# Patient Record
Sex: Male | Born: 1955 | Race: White | Hispanic: No | Marital: Married | Smoking: Never smoker
Health system: Southern US, Community
[De-identification: ages and names within clinical notes are randomized; demographics above are authoritative.]

## PROBLEM LIST (undated history)

## (undated) DIAGNOSIS — I4510 Unspecified right bundle-branch block: Secondary | ICD-10-CM

## (undated) DIAGNOSIS — M549 Dorsalgia, unspecified: Secondary | ICD-10-CM

## (undated) DIAGNOSIS — R109 Unspecified abdominal pain: Secondary | ICD-10-CM

## (undated) DIAGNOSIS — I10 Essential (primary) hypertension: Secondary | ICD-10-CM

## (undated) DIAGNOSIS — E739 Lactose intolerance, unspecified: Secondary | ICD-10-CM

## (undated) DIAGNOSIS — K5790 Diverticulosis of intestine, part unspecified, without perforation or abscess without bleeding: Secondary | ICD-10-CM

## (undated) DIAGNOSIS — M2550 Pain in unspecified joint: Secondary | ICD-10-CM

## (undated) DIAGNOSIS — N200 Calculus of kidney: Secondary | ICD-10-CM

## (undated) DIAGNOSIS — R7303 Prediabetes: Secondary | ICD-10-CM

## (undated) DIAGNOSIS — K5900 Constipation, unspecified: Secondary | ICD-10-CM

## (undated) DIAGNOSIS — T7840XA Allergy, unspecified, initial encounter: Secondary | ICD-10-CM

## (undated) HISTORY — DX: Dorsalgia, unspecified: M54.9

## (undated) HISTORY — DX: Constipation, unspecified: K59.00

## (undated) HISTORY — PX: POLYPECTOMY: SHX149

## (undated) HISTORY — DX: Prediabetes: R73.03

## (undated) HISTORY — DX: Essential (primary) hypertension: I10

## (undated) HISTORY — DX: Lactose intolerance, unspecified: E73.9

## (undated) HISTORY — DX: Pain in unspecified joint: M25.50

## (undated) HISTORY — PX: VASECTOMY: SHX75

## (undated) HISTORY — PX: REFRACTIVE SURGERY: SHX103

## (undated) HISTORY — DX: Allergy, unspecified, initial encounter: T78.40XA

## (undated) HISTORY — PX: COLONOSCOPY: SHX174

## (undated) HISTORY — DX: Calculus of kidney: N20.0

## (undated) HISTORY — DX: Diverticulosis of intestine, part unspecified, without perforation or abscess without bleeding: K57.90

## (undated) HISTORY — DX: Unspecified right bundle-branch block: I45.10

## (undated) HISTORY — DX: Unspecified abdominal pain: R10.9

---

## 1988-02-09 HISTORY — PX: VASECTOMY: SHX75

## 2001-05-16 ENCOUNTER — Emergency Department (HOSPITAL_COMMUNITY): Admission: EM | Admit: 2001-05-16 | Discharge: 2001-05-17 | Payer: Self-pay | Admitting: Emergency Medicine

## 2008-01-17 ENCOUNTER — Ambulatory Visit: Payer: Self-pay | Admitting: Gastroenterology

## 2008-01-23 ENCOUNTER — Ambulatory Visit: Payer: Self-pay | Admitting: Gastroenterology

## 2009-10-16 ENCOUNTER — Encounter: Admission: RE | Admit: 2009-10-16 | Discharge: 2009-10-16 | Payer: Self-pay | Admitting: Family Medicine

## 2011-03-09 ENCOUNTER — Ambulatory Visit (INDEPENDENT_AMBULATORY_CARE_PROVIDER_SITE_OTHER): Payer: BC Managed Care – PPO | Admitting: Family Medicine

## 2011-03-09 VITALS — BP 124/78 | HR 88 | Temp 98.5°F | Resp 16 | Ht 71.0 in | Wt 238.8 lb

## 2011-03-09 DIAGNOSIS — N451 Epididymitis: Secondary | ICD-10-CM

## 2011-03-09 DIAGNOSIS — N453 Epididymo-orchitis: Secondary | ICD-10-CM

## 2011-03-09 MED ORDER — CIPROFLOXACIN HCL 500 MG PO TABS: 500.0000 mg | ORAL_TABLET | Freq: Two times a day (BID) | ORAL | Status: AC

## 2011-03-09 NOTE — Progress Notes (Signed)
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 He comes in with a three-day history of constant left posterior testicle pain. He's not had this problem before. Some slight increase in size the left testicle as well. Had no fever, chills, nausea or vomiting.  S/P vasectomy 23 years ago  Objective: Left testicle is mildly swollen with no hydrocele noted. The posterior aspect of the left testicle is tender and slightly swollen.  Assessment: Epididymitis  Plan: Cipro. Patient instructed to call me back if not better in 24-48 hours.

## 2011-05-12 ENCOUNTER — Ambulatory Visit (INDEPENDENT_AMBULATORY_CARE_PROVIDER_SITE_OTHER): Payer: BC Managed Care – PPO | Admitting: Physician Assistant

## 2011-05-12 VITALS — BP 107/73 | HR 98 | Temp 99.0°F | Resp 16 | Ht 71.0 in | Wt 244.0 lb

## 2011-05-12 DIAGNOSIS — H1010 Acute atopic conjunctivitis, unspecified eye: Secondary | ICD-10-CM

## 2011-05-12 DIAGNOSIS — J309 Allergic rhinitis, unspecified: Secondary | ICD-10-CM

## 2011-05-12 DIAGNOSIS — H1045 Other chronic allergic conjunctivitis: Secondary | ICD-10-CM

## 2011-05-12 MED ORDER — AZELASTINE HCL 0.05 % OP SOLN: 1.0000 [drp] | Freq: Two times a day (BID) | OPHTHALMIC | Status: DC

## 2011-05-12 MED ORDER — FLUTICASONE PROPIONATE 50 MCG/ACT NA SUSP: 2.0000 | Freq: Every day | NASAL | Status: DC

## 2011-05-12 NOTE — Progress Notes (Signed)
  Subjective:    Patient ID: Maurice Hart, male    DOB: Nov 18, 1955, 56 y.o.   MRN: [id number]  HPI Oak pollen reaction annually.  Left eye gets red, itchy, swollen.  Symptoms began today. Has not yet developed the nasal symptoms, but often does 1-2 weeks after the eye symptoms begin.    No drainage, vision changes, fever, chills.  Accompanied by wife. Review of Systems As above.    Objective:   Physical Exam  Vital signs noted. Well-developed, well nourished WM who is awake, alert and oriented, in NAD. HEENT: [place]/AT, PERRL, EOMI.  Sclera and conjunctiva are mildly injected.  Fundi are normal bilaterally. Neck: supple, non-tender, no lymphadenopathy, thyromegaly.     Assessment & Plan:   1. Allergic conjunctivitis, acute  azelastine (OPTIVAR) 0.05 % ophthalmic solution  2. Allergic rhinitis  fluticasone (FLONASE) 50 MCG/ACT nasal spray   Allergen avoidance whenever possible.

## 2011-05-12 NOTE — Patient Instructions (Signed)
Allergen avoidance (wink, wink).

## 2011-09-02 ENCOUNTER — Ambulatory Visit (INDEPENDENT_AMBULATORY_CARE_PROVIDER_SITE_OTHER): Payer: BC Managed Care – PPO | Admitting: Internal Medicine

## 2011-09-02 ENCOUNTER — Ambulatory Visit: Payer: BC Managed Care – PPO

## 2011-09-02 VITALS — BP 128/88 | HR 62 | Temp 97.7°F | Resp 17 | Ht 71.0 in | Wt 238.0 lb

## 2011-09-02 DIAGNOSIS — M25569 Pain in unspecified knee: Secondary | ICD-10-CM

## 2011-09-02 DIAGNOSIS — M25462 Effusion, left knee: Secondary | ICD-10-CM

## 2011-09-02 DIAGNOSIS — R209 Unspecified disturbances of skin sensation: Secondary | ICD-10-CM

## 2011-09-02 DIAGNOSIS — M79601 Pain in right arm: Secondary | ICD-10-CM

## 2011-09-02 DIAGNOSIS — M25562 Pain in left knee: Secondary | ICD-10-CM

## 2011-09-02 DIAGNOSIS — M25539 Pain in unspecified wrist: Secondary | ICD-10-CM

## 2011-09-02 DIAGNOSIS — M25469 Effusion, unspecified knee: Secondary | ICD-10-CM

## 2011-09-02 MED ORDER — DOXYCYCLINE HYCLATE 100 MG PO TABS: 100.0000 mg | ORAL_TABLET | Freq: Two times a day (BID) | ORAL | Status: AC

## 2011-09-02 NOTE — Patient Instructions (Addendum)
Carpal Tunnel Syndrome You may have carpal tunnel syndrome. This is a common condition. Carpal tunnel syndrome occurs when the tendons, bones, or ligaments in the wrist press against the median nerve as it passes into the hand.  Symptoms can include:  Intermittent numbness.   Pain or a tingling sensation in thumb and first two fingers.  The pain may radiate up to the shoulder. There may even be weakness in the hand muscles. The pain is often worse at night and in the early morning. Nerve conduction tests may be used to prove the diagnosis. Carpal tunnel syndrome is most often due to repeated movements of the hand or wrist. Other causes can include:  Prior injuries.   Diabetes.   Obesity.   Smoking.   Pregnancy. Symptoms that develop during pregnancy often stop when the pregnancy is over.  Treatment includes:  Splinting - A wrist splint helps prevent movements that irritate the nerve. Splints are especially helpful at night when the symptoms are often worse.   Ice packs - Cold packs applied to the palm side of the wrist for 20 minutes every 2 hours while awake may give some relief.   Medication - Medicine to reduce inflammation and pain are often used. Cortisone injections around the nerve may also bring improvement.  Severe cases of carpal tunnel syndrome can require surgery to relieve the pressure on the nerve. This may be necessary if there is evidence of weakness or decreased sensation in your hand, or if your symptoms do not improve with conservative treatment. See your caregiver for follow-up to be certain your condition is improving. Document Released: 02/25/2004 Document Revised: 09/29/2010 Document Reviewed: 11/23/2006 ExitCare Patient Information 2012 [employer]. 

## 2011-09-02 NOTE — Progress Notes (Signed)
  Subjective:    Patient ID: Maurice Hart, male    DOB: Sep 28, 1955, 56 y.o.   MRN: [id number]  HPI 2 problems, paresthesias left greater than right hand that wakes him up at night. Has been doing aggressive work building a garage. He also struck his left tibial tuberosity A few days ago and now has eccymosis knee to anle and tuberosity is swollen and warm. Will xr   Review of Systems     Objective:   Physical Exam Positive phallens left /right Swollen, tender and warm upper shin at tuberosity Knee joint exam is nl       Assessment & Plan:  R and L wrist splints Ice/ knee sleeve to protect knee Doxycycline for possible early cellulitis

## 2011-09-06 NOTE — Progress Notes (Signed)
  Subjective:    Patient ID: Maurice Hart, male    DOB: 10-Oct-1955, 56 y.o.   MRN: [id number]  HPI    Review of Systems     Objective:   Physical Exam  [facility] reading (PRIMARY) by  Dr.Indie Nickerson nl knee .        Assessment & Plan:

## 2011-12-20 ENCOUNTER — Ambulatory Visit (INDEPENDENT_AMBULATORY_CARE_PROVIDER_SITE_OTHER): Payer: BC Managed Care – PPO | Admitting: Family Medicine

## 2011-12-20 DIAGNOSIS — Z23 Encounter for immunization: Secondary | ICD-10-CM

## 2012-01-19 ENCOUNTER — Encounter: Payer: Self-pay | Admitting: Family Medicine

## 2012-01-19 ENCOUNTER — Ambulatory Visit (INDEPENDENT_AMBULATORY_CARE_PROVIDER_SITE_OTHER): Payer: BC Managed Care – PPO | Admitting: Family Medicine

## 2012-01-19 VITALS — BP 118/80 | HR 72 | Temp 98.3°F | Resp 16 | Ht 72.0 in | Wt 241.0 lb

## 2012-01-19 DIAGNOSIS — H1010 Acute atopic conjunctivitis, unspecified eye: Secondary | ICD-10-CM

## 2012-01-19 DIAGNOSIS — Z0000 Encounter for general adult medical examination without abnormal findings: Secondary | ICD-10-CM

## 2012-01-19 LAB — POCT URINALYSIS DIPSTICK
Bilirubin, UA: NEGATIVE
Blood, UA: NEGATIVE
Glucose, UA: NEGATIVE
Ketones, UA: NEGATIVE
Leukocytes, UA: NEGATIVE
Nitrite, UA: NEGATIVE
Protein, UA: NEGATIVE
Spec Grav, UA: 1.025
Urobilinogen, UA: 0.2
pH, UA: 7

## 2012-01-19 LAB — CBC
HCT: 44.8 % (ref 39.0–52.0)
Hemoglobin: 15.2 g/dL (ref 13.0–17.0)
MCH: 28.8 pg (ref 26.0–34.0)
MCHC: 33.9 g/dL (ref 30.0–36.0)
MCV: 84.8 fL (ref 78.0–100.0)
Platelets: 174 10*3/uL (ref 150–400)
RBC: 5.28 MIL/uL (ref 4.22–5.81)
RDW: 13.4 % (ref 11.5–15.5)
WBC: 5.1 10*3/uL (ref 4.0–10.5)

## 2012-01-19 LAB — COMPREHENSIVE METABOLIC PANEL
ALT: 17 U/L (ref 0–53)
AST: 18 U/L (ref 0–37)
Albumin: 4.2 g/dL (ref 3.5–5.2)
Alkaline Phosphatase: 46 U/L (ref 39–117)
BUN: 21 mg/dL (ref 6–23)
CO2: 27 mEq/L (ref 19–32)
Calcium: 9.1 mg/dL (ref 8.4–10.5)
Chloride: 104 mEq/L (ref 96–112)
Creat: 1.2 mg/dL (ref 0.50–1.35)
Glucose, Bld: 99 mg/dL (ref 70–99)
Potassium: 4 mEq/L (ref 3.5–5.3)
Sodium: 139 mEq/L (ref 135–145)
Total Bilirubin: 0.9 mg/dL (ref 0.3–1.2)
Total Protein: 6.4 g/dL (ref 6.0–8.3)

## 2012-01-19 LAB — IFOBT (OCCULT BLOOD): IFOBT: NEGATIVE

## 2012-01-19 LAB — LIPID PANEL
Cholesterol: 169 mg/dL (ref 0–200)
HDL: 36 mg/dL — ABNORMAL LOW (ref >39)
LDL Cholesterol: 106 mg/dL — ABNORMAL HIGH (ref 0–99)
Total CHOL/HDL Ratio: 4.7 Ratio
Triglycerides: 137 mg/dL (ref <150)
VLDL: 27 mg/dL (ref 0–40)

## 2012-01-19 MED ORDER — AZELASTINE HCL 0.05 % OP SOLN: 1.0000 [drp] | Freq: Two times a day (BID) | OPHTHALMIC | Status: AC

## 2012-01-19 NOTE — Progress Notes (Signed)
Patient ID: Maurice Hart MRN: [id number], DOB: [birth year] 56 y.o. Date of Encounter: 01/19/2012, 12:09 PM  Primary Physician: No primary provider on file.  Chief Complaint: Physical (CPE)  HPI: 56 y.o. y/o male with history noted below here for CPE.  Doing well. He recently built a 2 story garage and developed carpal tunnel predominantly in the right side. He is splints and gradually the numbness is gone away he has no pain or loss of sensation in her hands at the present time. No issues/complaints. 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  2 sons.  Not as much exercise and weight loss as he'd like, but he is active designing sets.  Review of Systems: Consitutional: No fever, chills, fatigue, night sweats, lymphadenopathy, or weight changes. Eyes: No visual changes, eye redness, or discharge. ENT/Mouth: Ears: No otalgia, tinnitus, hearing loss, discharge. Nose: No congestion, rhinorrhea, sinus pain, or epistaxis. Throat: No sore throat, post nasal drip, or teeth pain. Cardiovascular: No CP, palpitations, diaphoresis, DOE, edema, orthopnea, PND. Respiratory: No cough, hemoptysis, SOB, or wheezing. Gastrointestinal: No anorexia, dysphagia, reflux, pain, nausea, vomiting, hematemesis, diarrhea, constipation, BRBPR, or melena. Genitourinary: No dysuria, frequency, urgency, hematuria, incontinence, nocturia, decreased urinary stream, discharge, impotence, occasional left testicle tenderness (none now) Musculoskeletal: No decreased ROM, myalgias, stiffness, joint swelling, or weakness. Skin: No rash, erythema, lesion changes, pain, warmth, jaundice, or pruritis. Neurological: No headache, dizziness, syncope, seizures, tremors, memory loss, coordination problems, or paresthesias. Psychological: No anxiety, depression, hallucinations, SI/HI. Endocrine: No fatigue, polydipsia, polyphagia, polyuria, or known diabetes. All other systems  were reviewed and are otherwise negative.  No past medical history on file.   Past Surgical History  Procedure Date  . Vasectomy [id number]    Home Meds:  Prior to Admission medications   Medication Sig Start Date End Date Taking? Authorizing Provider  azelastine (OPTIVAR) 0.05 % ophthalmic solution Place 1 drop into the left eye 2 (two) times daily. 05/12/11 05/11/12  Chelle S Jeffery, PA-C  fluticasone (FLONASE) 50 MCG/ACT nasal spray Place 2 sprays into the nose daily. 05/12/11 05/11/12  Chelle Tessa Lerner, PA-C    Allergies:  Allergies  Allergen Reactions  . Erythromycin Diarrhea    History   Social History  . Marital Status: Married    Spouse Name: N/A    Number of Children: N/A  . Years of Education: N/A   Occupational History  . Not on file.   Social History Main Topics  . Smoking status: Never Smoker   . Smokeless tobacco: Not on file  . Alcohol Use: Not on file  . Drug Use: Not on file  . Sexually Active: Not on file   Other Topics Concern  . Not on file   Social History Narrative  . No narrative on file    No family history on file.  Physical Exam: Blood pressure 118/70, pulse 72, temperature 98.3 F (36.8 C), temperature source Oral, resp. rate 16.  General: Well developed, well nourished, in no acute distress. HEENT: Normocephalic, atraumatic. Conjunctiva pink, sclera non-icteric. Pupils 2 mm constricting to 1 mm, round, regular, and equally reactive to light and accomodation. EOMI. Internal auditory canal clear. TMs with good cone of light and without pathology. Nasal mucosa pink. Nares are without discharge. No sinus tenderness. Oral mucosa pink. Dentition good. Pharynx without exudate.   Neck: Supple. Trachea midline. No thyromegaly. Full ROM. No lymphadenopathy. Lungs: Clear to auscultation bilaterally without wheezes, rales, or rhonchi.  Breathing is of normal effort and unlabored. Cardiovascular: RRR with S1 S2. No murmurs, rubs, or gallops  appreciated. Distal pulses 2+ symmetrically. No carotid or abdominal bruits Abdomen: Soft, non-tender, non-distended with normoactive bowel sounds. No hepatosplenomegaly or masses. No rebound/guarding. No CVA tenderness. Without hernias.  Rectal: No external hemorrhoids or fissures. Rectal vault without masses.  Genitourinary:  circumcised male. No penile lesions. Testes descended bilaterally, and smooth without tenderness or masses.  Musculoskeletal: Full range of motion and 5/5 strength throughout. Without swelling, atrophy, tenderness, crepitus, or warmth. Extremities without clubbing, cyanosis, or edema. Calves supple. Skin: Warm and moist without erythema, ecchymosis, wounds, or rash. Neuro: A+Ox3. CN II-XII grossly intact. Moves all extremities spontaneously. Full sensation throughout. Normal gait. DTR 2+ throughout upper and lower extremities. Finger to nose intact. Psych:  Responds to questions appropriately with a normal affect.   Studies: CBC, CMET, Lipid, PSA pending UA:   Assessment/Plan:  56 y.o. y/o  male here for CPE  Recurrent epididymitis:  Doxycycline 100 bid x 7 days as open prescription with 3 refills - 1. Annual physical exam  PSA, Lipid panel, Comprehensive metabolic panel, POCT urinalysis dipstick, CBC, IFOBT POC (occult bld, rslt in office)  2. Allergic conjunctivitis, acute  azelastine (OPTIVAR) 0.05 % ophthalmic solution   Signed, Elvina Sidle, MD 01/19/2012 12:09 PM

## 2012-01-19 NOTE — Patient Instructions (Signed)
Health Maintenance, Males A healthy lifestyle and preventative care can promote health and wellness.  Maintain regular health, dental, and eye exams.  Eat a healthy diet. Foods like vegetables, fruits, whole grains, low-fat dairy products, and lean protein foods contain the nutrients you need without too many calories. Decrease your intake of foods high in solid fats, added sugars, and salt. Get information about a proper diet from your caregiver, if necessary.  Regular physical exercise is one of the most important things you can do for your health. Most adults should get at least 150 minutes of moderate-intensity exercise (any activity that increases your heart rate and causes you to sweat) each week. In addition, most adults need muscle-strengthening exercises on 2 or more days a week.   Maintain a healthy weight. The body mass index (BMI) is a screening tool to identify possible weight problems. It provides an estimate of body fat based on height and weight. Your caregiver can help determine your BMI, and can help you achieve or maintain a healthy weight. For adults 20 years and older:  A BMI below 18.5 is considered underweight.  A BMI of 18.5 to 24.9 is normal.  A BMI of 25 to 29.9 is considered overweight.  A BMI of 30 and above is considered obese.  Maintain normal blood lipids and cholesterol by exercising and minimizing your intake of saturated fat. Eat a balanced diet with plenty of fruits and vegetables. Blood tests for lipids and cholesterol should begin at age 20 and be repeated every 5 years. If your lipid or cholesterol levels are high, you are over 50, or you are a high risk for heart disease, you may need your cholesterol levels checked more frequently.Ongoing high lipid and cholesterol levels should be treated with medicines, if diet and exercise are not effective.  If you smoke, find out from your caregiver how to quit. If you do not use tobacco, do not start.  If you  choose to drink alcohol, do not exceed 2 drinks per day. One drink is considered to be 12 ounces (355 mL) of beer, 5 ounces (148 mL) of wine, or 1.5 ounces (44 mL) of liquor.  Avoid use of street drugs. Do not share needles with anyone. Ask for help if you need support or instructions about stopping the use of drugs.  High blood pressure causes heart disease and increases the risk of stroke. Blood pressure should be checked at least every 1 to 2 years. Ongoing high blood pressure should be treated with medicines if weight loss and exercise are not effective.  If you are 45 to 56 years old, ask your caregiver if you should take aspirin to prevent heart disease.  Diabetes screening involves taking a blood sample to check your fasting blood sugar level. This should be done once every 3 years, after age 45, if you are within normal weight and without risk factors for diabetes. Testing should be considered at a younger age or be carried out more frequently if you are overweight and have at least 1 risk factor for diabetes.  Colorectal cancer can be detected and often prevented. Most routine colorectal cancer screening begins at the age of 50 and continues through age 75. However, your caregiver may recommend screening at an earlier age if you have risk factors for colon cancer. On a yearly basis, your caregiver may provide home test kits to check for hidden blood in the stool. Use of a small camera at the end of a tube,   to directly examine the colon (sigmoidoscopy or colonoscopy), can detect the earliest forms of colorectal cancer. Talk to your caregiver about this at age 50, when routine screening begins. Direct examination of the colon should be repeated every 5 to 10 years through age 75, unless early forms of pre-cancerous polyps or small growths are found.  Hepatitis C blood testing is recommended for all people born from 1945 through 1965 and any individual with known risks for hepatitis C.  Healthy  men should no longer receive prostate-specific antigen (PSA) blood tests as part of routine cancer screening. Consult with your caregiver about prostate cancer screening.  Testicular cancer screening is not recommended for adolescents or adult males who have no symptoms. Screening includes self-exam, caregiver exam, and other screening tests. Consult with your caregiver about any symptoms you have or any concerns you have about testicular cancer.  Practice safe sex. Use condoms and avoid high-risk sexual practices to reduce the spread of sexually transmitted infections (STIs).  Use sunscreen with a sun protection factor (SPF) of 30 or greater. Apply sunscreen liberally and repeatedly throughout the day. You should seek shade when your shadow is shorter than you. Protect yourself by wearing long sleeves, pants, a wide-brimmed hat, and sunglasses year round, whenever you are outdoors.  Notify your caregiver of new moles or changes in moles, especially if there is a change in shape or color. Also notify your caregiver if a mole is larger than the size of a pencil eraser.  A one-time screening for abdominal aortic aneurysm (AAA) and surgical repair of large AAAs by sound wave imaging (ultrasonography) is recommended for ages 65 to 75 years who are current or former smokers.  Stay current with your immunizations. Document Released: 07/16/2007 Document Revised: 04/11/2011 Document Reviewed: 06/14/2010 Exit[employer] Patient Information 2013 [employer].  

## 2012-01-20 LAB — PSA: PSA: 1.5 ng/mL (ref <=4.00)

## 2012-07-16 ENCOUNTER — Ambulatory Visit (INDEPENDENT_AMBULATORY_CARE_PROVIDER_SITE_OTHER): Payer: BC Managed Care – PPO | Admitting: Emergency Medicine

## 2012-07-16 VITALS — BP 148/84 | HR 90 | Temp 98.0°F | Resp 16 | Ht 72.0 in | Wt 248.0 lb

## 2012-07-16 DIAGNOSIS — S0001XA Abrasion of scalp, initial encounter: Secondary | ICD-10-CM

## 2012-07-16 DIAGNOSIS — S0100XA Unspecified open wound of scalp, initial encounter: Secondary | ICD-10-CM

## 2012-07-16 DIAGNOSIS — IMO0002 Reserved for concepts with insufficient information to code with codable children: Secondary | ICD-10-CM

## 2012-07-16 DIAGNOSIS — S0101XA Laceration without foreign body of scalp, initial encounter: Secondary | ICD-10-CM

## 2012-07-16 DIAGNOSIS — S0990XA Unspecified injury of head, initial encounter: Secondary | ICD-10-CM

## 2012-07-16 NOTE — Progress Notes (Signed)
[facility] [address] [place] [address] [phone]  Date:  07/16/2012   Name:  Maurice Hart   DOB:  1955-05-01   MRN:  [id number]  PCP:  No primary provider on file.    Chief Complaint: Laceration   History of Present Illness:  Maurice Hart is a 57 y.o. 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  Immediately following, a hammer fell and hit him in the right parietal scalp.  He had no LOC and has no neuro or visual symptoms.  Is current on TD.  No improvement with over the counter medications or other home remedies. Denies other complaint or health concern today.   There are no active problems to display for this patient.   History reviewed. No pertinent past medical history.  Past Surgical History  Procedure Laterality Date  . Vasectomy  [id number]    History  Substance Use Topics  . Smoking status: Never Smoker   . Smokeless tobacco: Not on file  . Alcohol Use: 1.2 oz/week    1 Cans of beer, 1 Glasses of wine per week    Family History  Problem Relation Age of Onset  . Cancer Brother   . Cancer Mother   . Diabetes Mother   . Diabetes Father   . Cancer Father   . Heart disease Father   . Diabetes Sister   . Stroke Maternal Grandfather   . Stroke Paternal Grandfather     Allergies  Allergen Reactions  . Erythromycin Diarrhea    Medication list has been reviewed and updated.  Current Outpatient Prescriptions on File Prior to Visit  Medication Sig Dispense Refill  . azelastine (OPTIVAR) 0.05 % ophthalmic solution Place 1 drop into the left eye 2 (two) times daily.  6 mL  3  . fluticasone (FLONASE) 50 MCG/ACT nasal spray Place 2 sprays into the nose daily.  16 g  12   No current facility-administered medications on file prior to visit.    Review of Systems:  As per HPI, otherwise negative.    Physical Examination: Filed  Vitals:   07/16/12 1446  BP: 148/84  Pulse: 90  Temp: 98 F (36.7 C)  Resp: 16   Filed Vitals:   07/16/12 1446  Height: 6' (1.829 m)  Weight: 248 lb (112.492 kg)   Body mass index is 33.63 kg/(m^2). Ideal Body Weight: Weight in (lb) to have BMI = 25: 183.9   GEN: WDWN, NAD, Non-toxic, Alert & Oriented x 3 HEENT: abrasion left crown and a 1 cm laceration on the right crown.  No crepitus or FB.  , Normocephalic. PRRERLA EOMI CN 2-12 intact. Ears and Nose: No external deformity. EXTR: No clubbing/cyanosis/edema NEURO: Normal gait.  PSYCH: Normally interactive. Conversant. Not depressed or anxious appearing.  Calm demeanor.    Assessment and Plan: Laceration scalp Abrasion scalp Wound closed with one staple following a betadine prep. Follow up in one week.     Signed,  Phillips Odor, MD

## 2012-07-16 NOTE — Patient Instructions (Addendum)
Laceration Care, Adult A laceration is a cut or lesion that goes through all layers of the skin and into the tissue just beneath the skin. TREATMENT  Some lacerations may not require closure. Some lacerations may not be able to be closed due to an increased risk of infection. It is important to see your caregiver as soon as possible after an injury to minimize the risk of infection and maximize the opportunity for successful closure. If closure is appropriate, pain medicines may be given, if needed. The wound will be cleaned to help prevent infection. Your caregiver will use stitches (sutures), staples, wound glue (adhesive), or skin adhesive strips to repair the laceration. These tools bring the skin edges together to allow for faster healing and a better cosmetic outcome. However, all wounds will heal with a scar. Once the wound has healed, scarring can be minimized by covering the wound with sunscreen during the day for 1 full year. HOME CARE INSTRUCTIONS  For sutures or staples:  Keep the wound clean and dry.  If you were given a bandage (dressing), you should change it at least once a day. Also, change the dressing if it becomes wet or dirty, or as directed by your caregiver.  Wash the wound with soap and water 2 times a day. Rinse the wound off with water to remove all soap. Pat the wound dry with a clean towel.  After cleaning, apply a thin layer of the antibiotic ointment as recommended by your caregiver. This will help prevent infection and keep the dressing from sticking.  You may shower as usual after the first 24 hours. Do not soak the wound in water until the sutures are removed.  Only take over-the-counter or prescription medicines for pain, discomfort, or fever as directed by your caregiver.  Get your sutures or staples removed as directed by your caregiver. For skin adhesive strips:  Keep the wound clean and dry.  Do not get the skin adhesive strips wet. You may bathe  carefully, using caution to keep the wound dry.  If the wound gets wet, pat it dry with a clean towel.  Skin adhesive strips will fall off on their own. You may trim the strips as the wound heals. Do not remove skin adhesive strips that are still stuck to the wound. They will fall off in time. For wound adhesive:  You may briefly wet your wound in the shower or bath. Do not soak or scrub the wound. Do not swim. Avoid periods of heavy perspiration until the skin adhesive has fallen off on its own. After showering or bathing, gently pat the wound dry with a clean towel.  Do not apply liquid medicine, cream medicine, or ointment medicine to your wound while the skin adhesive is in place. This may loosen the film before your wound is healed.  If a dressing is placed over the wound, be careful not to apply tape directly over the skin adhesive. This may cause the adhesive to be pulled off before the wound is healed.  Avoid prolonged exposure to sunlight or tanning lamps while the skin adhesive is in place. Exposure to ultraviolet light in the first year will darken the scar.  The skin adhesive will usually remain in place for 5 to 10 days, then naturally fall off the skin. Do not pick at the adhesive film. You may need a tetanus shot if:  You cannot remember when you had your last tetanus shot.  You have never had a tetanus  shot. If you get a tetanus shot, your arm may swell, get red, and feel warm to the touch. This is common and not a problem. If you need a tetanus shot and you choose not to have one, there is a rare chance of getting tetanus. Sickness from tetanus can be serious. SEEK MEDICAL CARE IF:   You have redness, swelling, or increasing pain in the wound.  You see a red line that goes away from the wound.  You have yellowish-white fluid (pus) coming from the wound.  You have a fever.  You notice a bad smell coming from the wound or dressing.  Your wound breaks open before or  after sutures have been removed.  You notice something coming out of the wound such as wood or glass.  Your wound is on your hand or foot and you cannot move a finger or toe. SEEK IMMEDIATE MEDICAL CARE IF:   Your pain is not controlled with prescribed medicine.  You have severe swelling around the wound causing pain and numbness or a change in color in your arm, hand, leg, or foot.  Your wound splits open and starts bleeding.  You have worsening numbness, weakness, or loss of function of any joint around or beyond the wound.  You develop painful lumps near the wound or on the skin anywhere on your body. MAKE SURE YOU:   Understand these instructions.  Will watch your condition.  Will get help right away if you are not doing well or get worse. Document Released: 01/17/2005 Document Revised: 04/11/2011 Document Reviewed: 07/13/2010 [facility] Patient Information 2014 [place], [place]. Head Injury, Adult You have had a head injury that does not appear serious at this time. A concussion is a state of changed mental ability, usually from a blow to the head. You should take clear liquids for the rest of the day and then resume your regular diet. You should not take sedatives or alcoholic beverages for as long as directed by your caregiver after discharge. After injuries such as yours, most problems occur within the first 24 hours. SYMPTOMS These minor symptoms may be experienced after discharge:  Memory difficulties.  Dizziness.  Headaches.  Double vision.  Hearing difficulties.  Depression.  Tiredness.  Weakness.  Difficulty with concentration. If you experience any of these problems, you should not be alarmed. A concussion requires a few days for recovery. Many patients with head injuries frequently experience such symptoms. Usually, these problems disappear without medical care. If symptoms last for more than one day, notify your caregiver. See your caregiver sooner if  symptoms are becoming worse rather than better. HOME CARE INSTRUCTIONS   During the next 24 hours you must stay with someone who can watch you for the warning signs listed below. Although it is unlikely that serious side effects will occur, you should be aware of signs and symptoms which may necessitate your return to this location. Side effects may occur up to 7  10 days following the injury. It is important for you to carefully monitor your condition and contact your caregiver or seek immediate medical attention if there is a change in your condition. SEEK IMMEDIATE MEDICAL CARE IF:   There is confusion or drowsiness.  You can not awaken the injured person.  There is nausea (feeling sick to your stomach) or continued, forceful vomiting.  You notice dizziness or unsteadiness which is getting worse, or inability to walk.  You have convulsions or unconsciousness.  You experience severe, persistent headaches not  is a change in your condition.  SEEK IMMEDIATE MEDICAL CARE IF:    There is confusion or drowsiness.   You can not awaken the injured person.   There is nausea (feeling sick to your stomach) or continued, forceful vomiting.   You notice dizziness or unsteadiness which is getting worse, or inability to walk.   You have convulsions or unconsciousness.   You experience severe, persistent headaches not relieved by over-the-counter or prescription medicines for pain. (Do not take aspirin as this impairs clotting abilities). Take other pain medications only as directed.   You can not use arms or legs normally.   There is clear or bloody discharge from the nose or ears.  MAKE SURE YOU:    Understand these instructions.   Will watch your condition.   Will get help right away if you are not doing well or get worse.  Document Released: 01/17/2005 Document Revised: 04/11/2011 Document Reviewed: 12/05/2008  [employer] Patient Information 2014 [employer].

## 2012-07-23 ENCOUNTER — Ambulatory Visit (INDEPENDENT_AMBULATORY_CARE_PROVIDER_SITE_OTHER): Payer: BC Managed Care – PPO | Admitting: Family Medicine

## 2012-07-23 DIAGNOSIS — M79609 Pain in unspecified limb: Secondary | ICD-10-CM

## 2012-07-23 NOTE — Progress Notes (Signed)
57 year old man who cut his scalp one week ago while working on his garage addition. He's had no problems since a staple was placed in the right parietal region.  Objective: Well-healed laceration right parietal region with staple removed atraumatically.  Assessment: Well-healed laceration, staple removal

## 2013-01-14 ENCOUNTER — Encounter: Payer: Self-pay | Admitting: Gastroenterology

## 2013-04-25 ENCOUNTER — Encounter: Payer: Self-pay | Admitting: Family Medicine

## 2013-04-25 ENCOUNTER — Ambulatory Visit (INDEPENDENT_AMBULATORY_CARE_PROVIDER_SITE_OTHER): Payer: BC Managed Care – PPO | Admitting: Family Medicine

## 2013-04-25 VITALS — BP 128/84 | HR 67 | Temp 98.7°F | Resp 16 | Ht 71.5 in | Wt 238.4 lb

## 2013-04-25 DIAGNOSIS — Z0000 Encounter for general adult medical examination without abnormal findings: Secondary | ICD-10-CM

## 2013-04-25 LAB — COMPLETE METABOLIC PANEL WITH GFR
ALT: 18 U/L (ref 0–53)
AST: 17 U/L (ref 0–37)
Albumin: 4.1 g/dL (ref 3.5–5.2)
Alkaline Phosphatase: 47 U/L (ref 39–117)
BUN: 16 mg/dL (ref 6–23)
CO2: 24 mEq/L (ref 19–32)
Calcium: 9 mg/dL (ref 8.4–10.5)
Chloride: 103 mEq/L (ref 96–112)
Creat: 1.23 mg/dL (ref 0.50–1.35)
GFR, Est African American: 75 mL/min
GFR, Est Non African American: 65 mL/min
Glucose, Bld: 104 mg/dL — ABNORMAL HIGH (ref 70–99)
Potassium: 4.3 mEq/L (ref 3.5–5.3)
Sodium: 137 mEq/L (ref 135–145)
Total Bilirubin: 0.8 mg/dL (ref 0.2–1.2)
Total Protein: 6.4 g/dL (ref 6.0–8.3)

## 2013-04-25 LAB — CBC
HCT: 43.9 % (ref 39.0–52.0)
Hemoglobin: 15.3 g/dL (ref 13.0–17.0)
MCH: 28.9 pg (ref 26.0–34.0)
MCHC: 34.9 g/dL (ref 30.0–36.0)
MCV: 82.8 fL (ref 78.0–100.0)
Platelets: 197 10*3/uL (ref 150–400)
RBC: 5.3 MIL/uL (ref 4.22–5.81)
RDW: 13.6 % (ref 11.5–15.5)
WBC: 4.2 10*3/uL (ref 4.0–10.5)

## 2013-04-25 LAB — LIPID PANEL
Cholesterol: 176 mg/dL (ref 0–200)
HDL: 34 mg/dL — ABNORMAL LOW (ref >39)
LDL Cholesterol: 117 mg/dL — ABNORMAL HIGH (ref 0–99)
Total CHOL/HDL Ratio: 5.2 Ratio
Triglycerides: 126 mg/dL (ref <150)
VLDL: 25 mg/dL (ref 0–40)

## 2013-04-25 LAB — POCT URINALYSIS DIPSTICK
Bilirubin, UA: NEGATIVE
Blood, UA: NEGATIVE
Glucose, UA: NEGATIVE
Ketones, UA: NEGATIVE
Leukocytes, UA: NEGATIVE
Nitrite, UA: NEGATIVE
Protein, UA: NEGATIVE
Spec Grav, UA: 1.01
Urobilinogen, UA: 0.2
pH, UA: 6

## 2013-04-25 LAB — IFOBT (OCCULT BLOOD): IFOBT: NEGATIVE

## 2013-04-25 NOTE — Progress Notes (Signed)
° °  Subjective:    Patient ID: Maurice Hart, male    DOB: 06/04/1955, 58 y.o.   MRN: [id number]  This chart was scribed for Robyn Haber, MD by Maree Erie, ED Scribe.   Chief Complaint  Patient presents with   Annual Exam    PCP: No primary provider on file.  HPI  Maurice Hart is a 58 y.o. male who presents to office for a complete physical exam. He states his allergies were mild last season and is hoping they are starting to subside. He is doing well.   He has some mild pain right underneath his right buttocks. He believes this may be due to exercise. He is using the elliptical and has decreased the resistance with some improvement on the pain.   Review of Systems Consitutional: No fever, chills, fatigue, night sweats, lymphadenopathy, or weight changes. Eyes: No visual changes, eye redness, or discharge. ENT/Mouth: Ears: No otalgia, tinnitus, hearing loss, discharge. Nose: No congestion, rhinorrhea, sinus pain, or epistaxis. Throat: No sore throat, post nasal drip, or teeth pain. Cardiovascular: No CP, palpitations, diaphoresis, DOE, edema, orthopnea, PND. Respiratory: No cough, hemoptysis, SOB, or wheezing. Gastrointestinal: No anorexia, dysphagia, reflux, pain, nausea, vomiting, hematemesis, diarrhea, constipation, BRBPR, or melena. Genitourinary: No dysuria, frequency, urgency, hematuria, incontinence, nocturia, decreased urinary stream, discharge, impotence, or testicular pain/masses. Musculoskeletal: No decreased ROM, stiffness, joint swelling, or weakness. Positive for right gluteal myalgias Skin: No rash, erythema, lesion changes, pain, warmth, jaundice, or pruritis. Neurological: No headache, dizziness, syncope, seizures, tremors, memory loss, coordination problems, or paresthesias. Psychological: No anxiety, depression, hallucinations, SI/HI. Endocrine: No fatigue, polydipsia, polyphagia, polyuria, or known diabetes.  All other systems were reviewed and are otherwise  negative.      Objective:   Physical Exam General: Well-developed, well-nourished male in no acute distress; appearance consistent with age of record HENT: normocephalic; atraumatic Eyes: pupils equal, round and reactive to light; extraocular muscles intact Neck: supple Heart: regular rate and rhythm; no murmurs, rubs or gallops Lungs: clear to auscultation bilaterally Abdomen: soft; nondistended; nontender; no masses or hepatosplenomegaly; bowel sounds present Extremities: No deformity; full range of motion; pulses normal Neurologic: Awake, alert and oriented; motor function intact in all extremities and symmetric; no facial droop Rectal: Musculoskeletal: Skin: Warm and dry Psychiatric: Normal mood and affect      Assessment & Plan:  No diagnosis found.  Routine general medical examination at a health care facility - Plan: POCT urinalysis dipstick, IFOBT POC (occult bld, rslt in office), Lipid panel, COMPLETE METABOLIC PANEL WITH GFR, CBC  Signed, Robyn Haber, MD

## 2013-04-25 NOTE — Progress Notes (Signed)
   Subjective:    Patient ID: Maurice Hart, male    DOB: 10/22/1955, 58 y.o.   MRN: [id number]  HPI    Review of Systems  Constitutional: Negative.   HENT: Negative.   Eyes: Negative.   Respiratory: Negative.   Cardiovascular: Negative.   Gastrointestinal: Negative.   Endocrine: Negative.   Musculoskeletal: Positive for myalgias.  Skin: Negative.   Allergic/Immunologic: Positive for environmental allergies.  Neurological: Negative.   Hematological: Negative.   Psychiatric/Behavioral: Negative.        Objective:   Physical Exam Cheerful adult male in no acute distress HEENT: Unremarkable Neck: Supple no adenopathy or thyromegaly Chest: Clear Heart: Regular no murmur Abdomen: Soft, nontender without guarding or rebound, no mass Rectal exam: Normal prostate, no masses Skin: Without suspicious lesions Neurologically: Normal mental status, intact cranial nerves, full range of motion of 4 extremities without edema  Results for orders placed in visit on 04/25/13  POCT URINALYSIS DIPSTICK      Result Value Ref Range   Color, UA yellow     Clarity, UA clear     Glucose, UA neg     Bilirubin, UA neg     Ketones, UA neg     Spec Grav, UA 1.010     Blood, UA neg     pH, UA 6.0     Protein, UA neg     Urobilinogen, UA 0.2     Nitrite, UA neg     Leukocytes, UA Negative    IFOBT (OCCULT BLOOD)      Result Value Ref Range   IFOBT Negative          Assessment & Plan:  Patient will be getting followup colonoscopy this year because of family history Routine general medical examination at a health care facility - Plan: POCT urinalysis dipstick, IFOBT POC (occult bld, rslt in office), Lipid panel, COMPLETE METABOLIC PANEL WITH GFR, CBC  Signed, Robyn Haber, MD

## 2013-07-04 ENCOUNTER — Encounter: Payer: Self-pay | Admitting: Gastroenterology

## 2013-07-25 ENCOUNTER — Ambulatory Visit (INDEPENDENT_AMBULATORY_CARE_PROVIDER_SITE_OTHER): Payer: BC Managed Care – PPO | Admitting: Family Medicine

## 2013-07-25 VITALS — BP 100/74 | HR 84 | Temp 98.8°F | Resp 16 | Ht 72.0 in | Wt 245.0 lb

## 2013-07-25 DIAGNOSIS — S81812A Laceration without foreign body, left lower leg, initial encounter: Secondary | ICD-10-CM

## 2013-07-25 DIAGNOSIS — S91009A Unspecified open wound, unspecified ankle, initial encounter: Secondary | ICD-10-CM

## 2013-07-25 DIAGNOSIS — S81009A Unspecified open wound, unspecified knee, initial encounter: Secondary | ICD-10-CM

## 2013-07-25 DIAGNOSIS — S81809A Unspecified open wound, unspecified lower leg, initial encounter: Secondary | ICD-10-CM

## 2013-07-26 MED ORDER — MUPIROCIN CALCIUM 2 % EX CREA: 1.0000 "application " | TOPICAL_CREAM | Freq: Two times a day (BID) | CUTANEOUS | Status: DC

## 2013-07-26 NOTE — Progress Notes (Signed)
Maurice Hart is a 58 y.o. male who presents to Urgent Care today with complaints of leg laceration:  1.  Leg lac:  Occurred Monday 4 days ago.  States he backed into sharp object while cleaning his yard.  Gashed his leg.  Able to control bleeding.  Has been applying Neosporin and washing with soap and water.  No pain.  Wife noticed some redness and asked that he come to be evaluated.  No purulent drainage.  No fevers or chills.  No red streaks extending from site.    PMH reviewed.  Past Medical History  Diagnosis Date  . Allergy    Past Surgical History  Procedure Laterality Date  . Vasectomy  [id number]    Medications reviewed. Current Outpatient Prescriptions  Medication Sig Dispense Refill  . aspirin 81 MG tablet Take 81 mg by mouth daily.      . fluticasone (FLONASE) 50 MCG/ACT nasal spray Place 2 sprays into the nose daily.  16 g  12   No current facility-administered medications for this visit.    ROS as above otherwise neg.  No chest pain, palpitations, SOB, Fever, Chills, Abd pain, N/V/D.   Physical Exam:  BP 100/74  Pulse 84  Temp(Src) 98.8 F (37.1 C) (Oral)  Resp 16  Ht 6' (1.829 m)  Wt 245 lb (111.131 kg)  BMI 33.22 kg/m2  SpO2 98% Gen:  Alert, cooperative patient who appears stated age in no acute distress.  Vital signs reviewed. Skin:  3 cm in diameter laceration on posterior portion of Left calf.  He does have 2 flaps of skin only a few millimeters in diameter which appear to be nonviable.  Some very minimal surrounding erythema.  No drainage from site.  Nontender.  No warmth/heat.  No lymphadenitis.  Assessment and Plan:  1.  Calf laceration: - injury >72 hours, heal by secondary intention.  No signs of infection - switch to bactroban, stop neosporin - continue keeping area clean soap and water - Bandage, can keep open at night.   - Return precautions provided.

## 2014-01-31 HISTORY — PX: COLONOSCOPY: SHX174

## 2014-07-04 ENCOUNTER — Encounter: Payer: Self-pay | Admitting: Gastroenterology

## 2014-07-12 ENCOUNTER — Ambulatory Visit (INDEPENDENT_AMBULATORY_CARE_PROVIDER_SITE_OTHER): Payer: BLUE CROSS/BLUE SHIELD

## 2014-07-12 ENCOUNTER — Ambulatory Visit (INDEPENDENT_AMBULATORY_CARE_PROVIDER_SITE_OTHER): Payer: BLUE CROSS/BLUE SHIELD | Admitting: Family Medicine

## 2014-07-12 VITALS — BP 120/86 | HR 82 | Temp 98.6°F | Resp 16 | Ht 72.0 in | Wt 255.4 lb

## 2014-07-12 DIAGNOSIS — M7071 Other bursitis of hip, right hip: Secondary | ICD-10-CM

## 2014-07-12 DIAGNOSIS — Z0000 Encounter for general adult medical examination without abnormal findings: Secondary | ICD-10-CM

## 2014-07-12 DIAGNOSIS — M722 Plantar fascial fibromatosis: Secondary | ICD-10-CM

## 2014-07-12 LAB — COMPLETE METABOLIC PANEL WITH GFR
ALT: 21 U/L (ref 0–53)
AST: 17 U/L (ref 0–37)
Albumin: 4.2 g/dL (ref 3.5–5.2)
Alkaline Phosphatase: 47 U/L (ref 39–117)
BUN: 26 mg/dL — ABNORMAL HIGH (ref 6–23)
CO2: 21 mEq/L (ref 19–32)
Calcium: 8.7 mg/dL (ref 8.4–10.5)
Chloride: 106 mEq/L (ref 96–112)
Creat: 1.25 mg/dL (ref 0.50–1.35)
GFR, Est African American: 72 mL/min
GFR, Est Non African American: 63 mL/min
Glucose, Bld: 116 mg/dL — ABNORMAL HIGH (ref 70–99)
Potassium: 4.1 mEq/L (ref 3.5–5.3)
Sodium: 137 mEq/L (ref 135–145)
Total Bilirubin: 0.6 mg/dL (ref 0.2–1.2)
Total Protein: 6.4 g/dL (ref 6.0–8.3)

## 2014-07-12 LAB — CBC WITH DIFFERENTIAL/PLATELET
Basophils Absolute: 0 10*3/uL (ref 0.0–0.1)
Basophils Relative: 1 % (ref 0–1)
Eosinophils Absolute: 0.2 10*3/uL (ref 0.0–0.7)
Eosinophils Relative: 4 % (ref 0–5)
HCT: 43.3 % (ref 39.0–52.0)
Hemoglobin: 15.1 g/dL (ref 13.0–17.0)
Lymphocytes Relative: 39 % (ref 12–46)
Lymphs Abs: 1.7 10*3/uL (ref 0.7–4.0)
MCH: 29.5 pg (ref 26.0–34.0)
MCHC: 34.9 g/dL (ref 30.0–36.0)
MCV: 84.7 fL (ref 78.0–100.0)
MPV: 9.7 fL (ref 8.6–12.4)
Monocytes Absolute: 0.4 10*3/uL (ref 0.1–1.0)
Monocytes Relative: 10 % (ref 3–12)
Neutro Abs: 2 10*3/uL (ref 1.7–7.7)
Neutrophils Relative %: 46 % (ref 43–77)
Platelets: 210 10*3/uL (ref 150–400)
RBC: 5.11 MIL/uL (ref 4.22–5.81)
RDW: 13.6 % (ref 11.5–15.5)
WBC: 4.3 10*3/uL (ref 4.0–10.5)

## 2014-07-12 LAB — LIPID PANEL
Cholesterol: 181 mg/dL (ref 0–200)
HDL: 31 mg/dL — ABNORMAL LOW (ref >=40)
LDL Cholesterol: 122 mg/dL — ABNORMAL HIGH (ref 0–99)
Total CHOL/HDL Ratio: 5.8 Ratio
Triglycerides: 140 mg/dL (ref <150)
VLDL: 28 mg/dL (ref 0–40)

## 2014-07-12 LAB — POCT URINALYSIS DIPSTICK
Bilirubin, UA: NEGATIVE
Blood, UA: NEGATIVE
Glucose, UA: NEGATIVE
Ketones, UA: NEGATIVE
Leukocytes, UA: NEGATIVE
Nitrite, UA: NEGATIVE
Protein, UA: NEGATIVE
Spec Grav, UA: >=1.03
Urobilinogen, UA: 0.2
pH, UA: 5.5

## 2014-07-12 LAB — IFOBT (OCCULT BLOOD): IFOBT: NEGATIVE

## 2014-07-12 MED ORDER — PREDNISONE 20 MG PO TABS: ORAL_TABLET | ORAL | Status: DC

## 2014-07-12 NOTE — Progress Notes (Signed)
Patient ID: Maurice Hart MRN: [id number], DOB: 1955-06-03 59 y.o. Date of Encounter: 07/12/2014, 9:17 AM  Primary Physician: No PCP Per Patient  Chief Complaint: Physical (CPE)  HPI: 59 y.o. y/o male with history noted below here for CPE.  Doing well. 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 Has 2 sons are doing relatively well although the younger is also in theater production develop some serious gastrointestinal problems this last spring thought to be secondary to water contamination in [place]. His son has come home until he gets relocated.  Patient continues be very physically active with sats at the theater as well as constructing a shed at home. He's developed some right hip pain over the last for 5 months as well as some plantar fasciitis in his right heel. Is also notices some numbness and tingling in his left foot around the second and third metatarsal heads.  Review of Systems: Consitutional: No fever, chills, fatigue, night sweats, lymphadenopathy, or weight changes. Eyes: No visual changes, eye redness, or discharge. ENT/Mouth: Ears: No otalgia, tinnitus, hearing loss, discharge. Nose: No congestion, rhinorrhea, sinus pain, or epistaxis. Throat: No sore throat, post nasal drip, or teeth pain. Cardiovascular: No CP, palpitations, diaphoresis, DOE, edema, orthopnea, PND. Respiratory: No cough, hemoptysis, SOB, or wheezing. Gastrointestinal: No anorexia, dysphagia, reflux, pain, nausea, vomiting, hematemesis, diarrhea, constipation, BRBPR, or melena. Genitourinary: No dysuria, frequency, urgency, hematuria, incontinence, nocturia, decreased urinary stream, discharge, impotence, or testicular pain/masses. Musculoskeletal: No decreased ROM, myalgias, stiffness, joint swelling, or weakness. Skin: No rash, erythema, lesion changes, pain, warmth, jaundice, or pruritis. Neurological: No headache, dizziness, syncope, seizures, tremors, memory loss, coordination  problems, or paresthesias. Psychological: No anxiety, depression, hallucinations, SI/HI. Endocrine: No fatigue, polydipsia, polyphagia, polyuria, or known diabetes. All other systems were reviewed and are otherwise negative.  Past Medical History  Diagnosis Date  . Allergy      Past Surgical History  Procedure Laterality Date  . Vasectomy  [id number]    Home Meds:  Prior to Admission medications   Medication Sig Start Date End Date Taking? Authorizing Provider  aspirin 81 MG tablet Take 81 mg by mouth daily.    Historical Provider, MD  fluticasone (FLONASE) 50 MCG/ACT nasal spray Place 2 sprays into the nose daily. 05/12/11 05/11/12  Chelle Jeffery, PA-C  mupirocin cream (BACTROBAN) 2 % Apply 1 application topically 2 (two) times daily. X 10 days Patient not taking: Reported on 07/12/2014 07/26/13   Alveda Reasons, MD    Allergies:  Allergies  Allergen Reactions  . Erythromycin Diarrhea    History   Social History  . Marital Status: Married    Spouse Name: N/A  . Number of Children: N/A  . Years of Education: N/A   Occupational History  . professor [employer]   Social History Main Topics  . Smoking status: Never Smoker   . Smokeless tobacco: Not on file  . Alcohol Use: 1.2 oz/week    1 Glasses of wine, 1 Cans of beer per week     Comment: 2-4 drinks  . Drug Use: No  . Sexual Activity: Not on file   Other Topics Concern  . Not on file   Social History Narrative   Married. Exercise: Yes. Education: College/Other.    Family History  Problem Relation Age of Onset  . Cancer Brother   . Cancer Mother   . Diabetes Mother   . Hypertension Mother   . Diabetes Father   . Cancer Father   .  Heart disease Father   . Stroke Father   . Diabetes Sister   . Stroke Maternal Grandfather   . Stroke Paternal Grandfather   . Cancer Maternal Grandmother     Physical Exam: Blood pressure 120/86, pulse 82, temperature 98.6 F (37 C), temperature source Oral,  resp. rate 16, height 6' (1.829 m), weight 255 lb 6.4 oz (115.849 kg), SpO2 98 %.  BP Readings from Last 3 Encounters:  07/12/14 120/86  07/25/13 100/74  04/25/13 128/84   General: Well developed, well nourished, in no acute distress. HEENT: Normocephalic, atraumatic. Conjunctiva pink, sclera non-icteric. Pupils 2 mm constricting to 1 mm, round, regular, and equally reactive to light and accomodation. EOMI.  Fundi benign   Internal auditory canal clear. TMs with good cone of light and without pathology. Nasal mucosa pink. Nares are without discharge. No sinus tenderness. Oral mucosa pink. Dentition good shape. Pharynx without exudate.    Neck: Supple. Trachea midline. No thyromegaly. Full ROM. No lymphadenopathy. Lungs: Clear to auscultation bilaterally without wheezes, rales, or rhonchi. Breathing is of normal effort and unlabored. Cardiovascular: RRR with S1 S2. No murmurs, rubs, or gallops appreciated. Distal pulses 2+ symmetrically. No carotid or abdominal bruits Abdomen: Soft, non-tender, non-distended with normoactive bowel sounds. No hepatosplenomegaly or masses. No rebound/guarding. No CVA tenderness. Without hernias.  Rectal: No external hemorrhoids or fissures. Rectal vault without masses.  Genitourinary:  circumcised male. No penile lesions. Testes descended bilaterally, and smooth without tenderness or masses.  Musculoskeletal: Full range of motion and 5/5 strength throughout. Without swelling, atrophy, tenderness, crepitus, or warmth. Extremities without clubbing, cyanosis, or edema. Calves supple. Patient does have some tenderness behind the right trochanter and in the medial heel pad of his right foot. Left foot palpation and inspection are normal. Skin: Warm and moist without erythema, ecchymosis, wounds, or rash. Neuro: A+Ox3. CN II-XII grossly intact. Moves all extremities spontaneously. Full sensation throughout. Normal gait. DTR 2+ throughout upper and lower extremities. Finger  to nose intact. Psych:  Responds to questions appropriately with a normal affect.   Lab Results  Component Value Date   CHOL 176 04/25/2013   CHOL 169 01/19/2012   Lab Results  Component Value Date   HDL 34* 04/25/2013   HDL 36* 01/19/2012   Lab Results  Component Value Date   LDLCALC 117* 04/25/2013   LDLCALC 106* 01/19/2012   Lab Results  Component Value Date   TRIG 126 04/25/2013   TRIG 137 01/19/2012   Lab Results  Component Value Date   CHOLHDL 5.2 04/25/2013   CHOLHDL 4.7 01/19/2012   [facility] reading (PRIMARY) by  Dr. Joseph Art:  Left hip normal.  Results for orders placed or performed in visit on 07/12/14  POCT urinalysis dipstick  Result Value Ref Range   Color, UA Amber    Clarity, UA Clear    Glucose, UA Negative    Bilirubin, UA Negative    Ketones, UA Negative    Spec Grav, UA >=1.030    Blood, UA Negative    pH, UA 5.5    Protein, UA Negative    Urobilinogen, UA 0.2    Nitrite, UA Negative    Leukocytes, UA Negative   IFOBT POC (occult bld, rslt in office)  Result Value Ref Range   IFOBT Negative       Assessment/Plan:  59 y.o. y/o healthy male here for CPE   ICD-9-CM ICD-10-CM   1. Annual physical exam V70.0 Z00.00 POCT urinalysis dipstick     IFOBT  POC (occult bld, rslt in office)     Lipid panel     COMPLETE METABOLIC PANEL WITH GFR     CBC with Differential/Platelet     PSA  2. Plantar fasciitis of right foot 728.71 M72.2 predniSONE (DELTASONE) 20 MG tablet  3. Hip bursitis, right 726.5 M70.71 predniSONE (DELTASONE) 20 MG tablet     DG HIP UNILAT WITH PELVIS 2-3 VIEWS RIGHT     DG HIP UNILAT WITH PELVIS 2-3 VIEWS RIGHT    Signed, Robyn Haber, MD 07/12/2014 9:17 AM     This chart was scribed in my presence and reviewed by me personally.    ICD-9-CM ICD-10-CM   1. Annual physical exam V70.0 Z00.00 POCT urinalysis dipstick     IFOBT POC (occult bld, rslt in office)     Lipid panel     COMPLETE METABOLIC PANEL WITH GFR      CBC with Differential/Platelet     PSA  2. Plantar fasciitis of right foot 728.71 M72.2 predniSONE (DELTASONE) 20 MG tablet  3. Hip bursitis, right 726.5 M70.71 predniSONE (DELTASONE) 20 MG tablet     DG HIP UNILAT WITH PELVIS 2-3 VIEWS RIGHT     DG HIP UNILAT WITH PELVIS 2-3 VIEWS RIGHT     Signed, Robyn Haber, MD

## 2014-07-12 NOTE — Progress Notes (Signed)
Patient ID: ESA RADEN MRN: [id number], DOB: 10-30-1955 59 y.o. Date of Encounter: 07/12/2014, 8:27 AM  Primary Physician: No PCP Per Patient  Chief Complaint: Physical (CPE)  HPI: 59 y.o. y/o male with history noted below here for CPE.  Doing well. No issues/complaints.  Review of Systems: Consitutional: No fever, chills, fatigue, night sweats, lymphadenopathy, or weight changes. Eyes: No visual changes, eye redness, or discharge. ENT/Mouth: Ears: No otalgia, tinnitus, hearing loss, discharge. Nose: No congestion, rhinorrhea, sinus pain, or epistaxis. Throat: No sore throat, post nasal drip, or teeth pain. Cardiovascular: No CP, palpitations, diaphoresis, DOE, edema, orthopnea, PND. Respiratory: No cough, hemoptysis, SOB, or wheezing. Gastrointestinal: No anorexia, dysphagia, reflux, pain, nausea, vomiting, hematemesis, diarrhea, constipation, BRBPR, or melena. Genitourinary: No dysuria, frequency, urgency, hematuria, incontinence, nocturia, decreased urinary stream, discharge, impotence, or testicular pain/masses. Musculoskeletal: No decreased ROM, myalgias, stiffness, joint swelling, or weakness. Skin: No rash, erythema, lesion changes, pain, warmth, jaundice, or pruritis. Neurological: No headache, dizziness, syncope, seizures, tremors, memory loss, coordination problems, or paresthesias. Psychological: No anxiety, depression, hallucinations, SI/HI. Endocrine: No fatigue, polydipsia, polyphagia, polyuria, or known diabetes. All other systems were reviewed and are otherwise negative.  Past Medical History  Diagnosis Date  . Allergy      Past Surgical History  Procedure Laterality Date  . Vasectomy  [id number]    Home Meds:  Prior to Admission medications   Medication Sig Start Date End Date Taking? Authorizing Provider  aspirin 81 MG tablet Take 81 mg by mouth daily.    Historical Provider, MD  fluticasone (FLONASE) 50 MCG/ACT nasal spray Place 2 sprays into the nose  daily. 05/12/11 05/11/12  Chelle Jeffery, PA-C  mupirocin cream (BACTROBAN) 2 % Apply 1 application topically 2 (two) times daily. X 10 days Patient not taking: Reported on 07/12/2014 07/26/13   Alveda Reasons, MD    Allergies:  Allergies  Allergen Reactions  . Erythromycin Diarrhea    History   Social History  . Marital Status: Married    Spouse Name: N/A  . Number of Children: N/A  . Years of Education: N/A   Occupational History  . professor [employer]   Social History Main Topics  . Smoking status: Never Smoker   . Smokeless tobacco: Not on file  . Alcohol Use: 1.2 oz/week    1 Glasses of wine, 1 Cans of beer per week     Comment: 2-4 drinks  . Drug Use: No  . Sexual Activity: Not on file   Other Topics Concern  . Not on file   Social History Narrative   Married. Exercise: Yes. Education: College/Other.    Family History  Problem Relation Age of Onset  . Cancer Brother   . Cancer Mother   . Diabetes Mother   . Hypertension Mother   . Diabetes Father   . Cancer Father   . Heart disease Father   . Stroke Father   . Diabetes Sister   . Stroke Maternal Grandfather   . Stroke Paternal Grandfather   . Cancer Maternal Grandmother     Physical Exam: Blood pressure 120/86, pulse 82, temperature 98.6 F (37 C), temperature source Oral, resp. rate 16, height 6' (1.829 m), weight 255 lb 6.4 oz (115.849 kg), SpO2 98 %.  BP Readings from Last 3 Encounters:  07/12/14 120/86  07/25/13 100/74  04/25/13 128/84   General: Well developed, well nourished, in no acute distress. HEENT: Normocephalic, atraumatic. Conjunctiva pink, sclera non-icteric. Pupils 2 mm constricting to 1  mm, round, regular, and equally reactive to light and accomodation. EOMI.  Fundi benign   Internal auditory canal clear. TMs with good cone of light and without pathology. Nasal mucosa pink. Nares are without discharge. No sinus tenderness. Oral mucosa pink. Dentition. Pharynx without  exudate.    Neck: Supple. Trachea midline. No thyromegaly. Full ROM. No lymphadenopathy. Lungs: Clear to auscultation bilaterally without wheezes, rales, or rhonchi. Breathing is of normal effort and unlabored. Cardiovascular: RRR with S1 S2. No murmurs, rubs, or gallops appreciated. Distal pulses 2+ symmetrically. No carotid or abdominal bruits Abdomen: Soft, non-tender, non-distended with normoactive bowel sounds. No hepatosplenomegaly or masses. No rebound/guarding. No CVA tenderness. Without hernias.  Rectal: No external hemorrhoids or fissures. Rectal vault without masses.  Genitourinary:  circumcised male. No penile lesions. Testes descended bilaterally, and smooth without tenderness or masses.  Musculoskeletal: Full range of motion and 5/5 strength throughout. Without swelling, atrophy, tenderness, crepitus, or warmth. Extremities without clubbing, cyanosis, or edema. Calves supple. Skin: Warm and moist without erythema, ecchymosis, wounds, or rash. Neuro: A+Ox3. CN II-XII grossly intact. Moves all extremities spontaneously. Full sensation throughout. Normal gait. DTR 2+ throughout upper and lower extremities. Finger to nose intact. Psych:  Responds to questions appropriately with a normal affect.   Lab Results  Component Value Date   CHOL 176 04/25/2013   CHOL 169 01/19/2012   Lab Results  Component Value Date   HDL 34* 04/25/2013   HDL 36* 01/19/2012   Lab Results  Component Value Date   LDLCALC 117* 04/25/2013   LDLCALC 106* 01/19/2012   Lab Results  Component Value Date   TRIG 126 04/25/2013   TRIG 137 01/19/2012   Lab Results  Component Value Date   CHOLHDL 5.2 04/25/2013   CHOLHDL 4.7 01/19/2012   No results found for: LDLDIRECT  Assessment/Plan:  59 y.o. y/o  male here for CPE No diagnosis found.  Signed, Robyn Haber, MD 07/12/2014 8:27 AM

## 2014-07-12 NOTE — Patient Instructions (Addendum)
Health Maintenance A healthy lifestyle and preventative care can promote health and wellness.  Maintain regular health, dental, and eye exams.  Eat a healthy diet. Foods like vegetables, fruits, whole grains, low-fat dairy products, and lean protein foods contain the nutrients you need and are low in calories. Decrease your intake of foods high in solid fats, added sugars, and salt. Get information about a proper diet from your health care provider, if necessary.  Regular physical exercise is one of the most important things you can do for your health. Most adults should get at least 150 minutes of moderate-intensity exercise (any activity that increases your heart rate and causes you to sweat) each week. In addition, most adults need muscle-strengthening exercises on 2 or more days a week.   Maintain a healthy weight. The body mass index (BMI) is a screening tool to identify possible weight problems. It provides an estimate of body fat based on height and weight. Your health care provider can find your BMI and can help you achieve or maintain a healthy weight. For males 20 years and older:  A BMI below 18.5 is considered underweight.  A BMI of 18.5 to 24.9 is normal.  A BMI of 25 to 29.9 is considered overweight.  A BMI of 30 and above is considered obese.  Maintain normal blood lipids and cholesterol by exercising and minimizing your intake of saturated fat. Eat a balanced diet with plenty of fruits and vegetables. Blood tests for lipids and cholesterol should begin at age 20 and be repeated every 5 years. If your lipid or cholesterol levels are high, you are over age 50, or you are at high risk for heart disease, you may need your cholesterol levels checked more frequently.Ongoing high lipid and cholesterol levels should be treated with medicines if diet and exercise are not working.  If you smoke, find out from your health care provider how to quit. If you do not use tobacco, do not  start.  Lung cancer screening is recommended for adults aged 55-80 years who are at high risk for developing lung cancer because of a history of smoking. A yearly low-dose CT scan of the lungs is recommended for people who have at least a 30-pack-year history of smoking and are current smokers or have quit within the past 15 years. A pack year of smoking is smoking an average of 1 pack of cigarettes a day for 1 year (for example, a 30-pack-year history of smoking could mean smoking 1 pack a day for 30 years or 2 packs a day for 15 years). Yearly screening should continue until the smoker has stopped smoking for at least 15 years. Yearly screening should be stopped for people who develop a health problem that would prevent them from having lung cancer treatment.  If you choose to drink alcohol, do not have more than 2 drinks per day. One drink is considered to be 12 oz (360 mL) of beer, 5 oz (150 mL) of wine, or 1.5 oz (45 mL) of liquor.  Avoid the use of street drugs. Do not share needles with anyone. Ask for help if you need support or instructions about stopping the use of drugs.  High blood pressure causes heart disease and increases the risk of stroke. Blood pressure should be checked at least every 1-2 years. Ongoing high blood pressure should be treated with medicines if weight loss and exercise are not effective.  If you are 45-79 years old, ask your health care provider if   you should take aspirin to prevent heart disease.  Diabetes screening involves taking a blood sample to check your fasting blood sugar level. This should be done once every 3 years after age 45 if you are at a normal weight and without risk factors for diabetes. Testing should be considered at a younger age or be carried out more frequently if you are overweight and have at least 1 risk factor for diabetes.  Colorectal cancer can be detected and often prevented. Most routine colorectal cancer screening begins at the age of 50  and continues through age 75. However, your health care provider may recommend screening at an earlier age if you have risk factors for colon cancer. On a yearly basis, your health care provider may provide home test kits to check for hidden blood in the stool. A small camera at the end of a tube may be used to directly examine the colon (sigmoidoscopy or colonoscopy) to detect the earliest forms of colorectal cancer. Talk to your health care provider about this at age 50 when routine screening begins. A direct exam of the colon should be repeated every 5-10 years through age 75, unless early forms of precancerous polyps or small growths are found.  People who are at an increased risk for hepatitis B should be screened for this virus. You are considered at high risk for hepatitis B if:  You were born in a country where hepatitis B occurs often. Talk with your health care provider about which countries are considered high risk.  Your parents were born in a high-risk country and you have not received a shot to protect against hepatitis B (hepatitis B vaccine).  You have HIV or AIDS.  You use needles to inject street drugs.  You live with, or have sex with, someone who has hepatitis B.  You are a man who has sex with other men (MSM).  You get hemodialysis treatment.  You take certain medicines for conditions like cancer, organ transplantation, and autoimmune conditions.  Hepatitis C blood testing is recommended for all people born from 1945 through 1965 and any individual with known risk factors for hepatitis C.  Healthy men should no longer receive prostate-specific antigen (PSA) blood tests as part of routine cancer screening. Talk to your health care provider about prostate cancer screening.  Testicular cancer screening is not recommended for adolescents or adult males who have no symptoms. Screening includes self-exam, a health care provider exam, and other screening tests. Consult with your  health care provider about any symptoms you have or any concerns you have about testicular cancer.  Practice safe sex. Use condoms and avoid high-risk sexual practices to reduce the spread of sexually transmitted infections (STIs).  You should be screened for STIs, including gonorrhea and chlamydia if:  You are sexually active and are younger than 24 years.  You are older than 24 years, and your health care provider tells you that you are at risk for this type of infection.  Your sexual activity has changed since you were last screened, and you are at an increased risk for chlamydia or gonorrhea. Ask your health care provider if you are at risk.  If you are at risk of being infected with HIV, it is recommended that you take a prescription medicine daily to prevent HIV infection. This is called pre-exposure prophylaxis (PrEP). You are considered at risk if:  You are a man who has sex with other men (MSM).  You are a heterosexual man who   is sexually active with multiple partners.  You take drugs by injection.  You are sexually active with a partner who has HIV.  Talk with your health care provider about whether you are at high risk of being infected with HIV. If you choose to begin PrEP, you should first be tested for HIV. You should then be tested every 3 months for as long as you are taking PrEP.  Use sunscreen. Apply sunscreen liberally and repeatedly throughout the day. You should seek shade when your shadow is shorter than you. Protect yourself by wearing long sleeves, pants, a wide-brimmed hat, and sunglasses year round whenever you are outdoors.  Tell your health care provider of new moles or changes in moles, especially if there is a change in shape or color. Also, tell your health care provider if a mole is larger than the size of a pencil eraser.  A one-time screening for abdominal aortic aneurysm (AAA) and surgical repair of large AAAs by ultrasound is recommended for men aged  75-75 years who are current or former smokers.  Stay current with your vaccines (immunizations). Document Released: 07/16/2007 Document Revised: 01/22/2013 Document Reviewed: 06/14/2010 [facility] Patient Information 2015 [place], [place]. This information is not intended to replace advice given to you by your health care provider. Make sure you discuss any questions you have with your health care provider. Plantar Fasciitis Plantar fasciitis is a common condition that causes foot pain. It is soreness (inflammation) of the band of tough fibrous tissue on the bottom of the foot that runs from the heel bone (calcaneus) to the ball of the foot. The cause of this soreness may be from excessive standing, poor fitting shoes, running on hard surfaces, being overweight, having an abnormal walk, or overuse (this is common in runners) of the painful foot or feet. It is also common in aerobic exercise dancers and ballet dancers. SYMPTOMS  Most people with plantar fasciitis complain of:  Severe pain in the morning on the bottom of their foot especially when taking the first steps out of bed. This pain recedes after a few minutes of walking.  Severe pain is experienced also during walking following a long period of inactivity.  Pain is worse when walking barefoot or up stairs DIAGNOSIS   Your caregiver will diagnose this condition by examining and feeling your foot.  Special tests such as X-rays of your foot, are usually not needed. PREVENTION   Consult a sports medicine professional before beginning a new exercise program.  Walking programs offer a good workout. With walking there is a lower chance of overuse injuries common to runners. There is less impact and less jarring of the joints.  Begin all new exercise programs slowly. If problems or pain develop, decrease the amount of time or distance until you are at a comfortable level.  Wear good shoes and replace them regularly.  Stretch your foot and  the heel cords at the back of the ankle (Achilles tendon) both before and after exercise.  Run or exercise on even surfaces that are not hard. For example, asphalt is better than pavement.  Do not run barefoot on hard surfaces.  If using a treadmill, vary the incline.  Do not continue to workout if you have foot or joint problems. Seek professional help if they do not improve. HOME CARE INSTRUCTIONS   Avoid activities that cause you pain until you recover.  Use ice or cold packs on the problem or painful areas after working out.  Only  take over-the-counter or prescription medicines for pain, discomfort, or fever as directed by your caregiver.  Soft shoe inserts or athletic shoes with air or gel sole cushions may be helpful.  If problems continue or become more severe, consult a sports medicine caregiver or your own health care provider. Cortisone is a potent anti-inflammatory medication that may be injected into the painful area. You can discuss this treatment with your caregiver. MAKE SURE YOU:   Understand these instructions.  Will watch your condition.  Will get help right away if you are not doing well or get worse. Document Released: 10/12/2000 Document Revised: 04/11/2011 Document Reviewed: 12/12/2007 [facility] Patient Information 2015 [place], [place]. This information is not intended to replace advice given to you by your health care provider. Make sure you discuss any questions you have with your health care provider.

## 2014-07-15 LAB — PSA: PSA: 1.49 ng/mL (ref <=4.00)

## 2014-08-25 ENCOUNTER — Ambulatory Visit (AMBULATORY_SURGERY_CENTER): Payer: Self-pay

## 2014-08-25 VITALS — Ht 72.0 in | Wt 255.0 lb

## 2014-08-25 DIAGNOSIS — Z800 Family history of malignant neoplasm of digestive organs: Secondary | ICD-10-CM

## 2014-08-25 MED ORDER — SUPREP BOWEL PREP KIT 17.5-3.13-1.6 GM/177ML PO SOLN: 1.0000 | Freq: Once | ORAL | Status: DC

## 2014-08-25 NOTE — Progress Notes (Signed)
No allergies to eggs or soy No past problems with anesthesia No home oxygen No diet/weight loss meds  Does not want emmi video; has had procedure before

## 2014-09-08 ENCOUNTER — Ambulatory Visit (AMBULATORY_SURGERY_CENTER): Payer: BLUE CROSS/BLUE SHIELD | Admitting: Gastroenterology

## 2014-09-08 ENCOUNTER — Encounter: Payer: Self-pay | Admitting: Gastroenterology

## 2014-09-08 VITALS — BP 113/72 | HR 64 | Temp 97.5°F | Resp 11 | Ht 72.0 in | Wt 255.0 lb

## 2014-09-08 DIAGNOSIS — K5730 Diverticulosis of large intestine without perforation or abscess without bleeding: Secondary | ICD-10-CM

## 2014-09-08 DIAGNOSIS — Z1211 Encounter for screening for malignant neoplasm of colon: Secondary | ICD-10-CM

## 2014-09-08 DIAGNOSIS — Z800 Family history of malignant neoplasm of digestive organs: Secondary | ICD-10-CM | POA: Diagnosis present

## 2014-09-08 MED ORDER — SODIUM CHLORIDE 0.9 % IV SOLN: 500.0000 mL | INTRAVENOUS | Status: DC

## 2014-09-08 NOTE — Patient Instructions (Signed)
YOU HAD AN ENDOSCOPIC PROCEDURE TODAY AT THE [facility]:   Refer to the procedure report that was given to you for any specific questions about what was found during the examination.  If the procedure report does not answer your questions, please call your gastroenterologist to clarify.  If you requested that your care partner not be given the details of your procedure findings, then the procedure report has been included in a sealed envelope for you to review at your convenience later.  YOU SHOULD EXPECT: Some feelings of bloating in the abdomen. Passage of more gas than usual.  Walking can help get rid of the air that was put into your GI tract during the procedure and reduce the bloating. If you had a lower endoscopy (such as a colonoscopy or flexible sigmoidoscopy) you may notice spotting of blood in your stool or on the toilet paper. If you underwent a bowel prep for your procedure, you may not have a normal bowel movement for a few days.  Please Note:  You might notice some irritation and congestion in your nose or some drainage.  This is from the oxygen used during your procedure.  There is no need for concern and it should clear up in a day or so.  SYMPTOMS TO REPORT IMMEDIATELY:   Following lower endoscopy (colonoscopy or flexible sigmoidoscopy):  Excessive amounts of blood in the stool  Significant tenderness or worsening of abdominal pains  Swelling of the abdomen that is new, acute  Fever of 100F or higher    For urgent or emergent issues, a gastroenterologist can be reached at any hour by calling [phone].   DIET: Your first meal following the procedure should be a small meal and then it is ok to progress to your normal diet. Heavy or fried foods are harder to digest and may make you feel nauseous or bloated.  Likewise, meals heavy in dairy and vegetables can increase bloating.  Drink plenty of fluids but you should avoid alcoholic beverages for 24  hours.  ACTIVITY:  You should plan to take it easy for the rest of today and you should NOT DRIVE or use heavy machinery until tomorrow (because of the sedation medicines used during the test).    FOLLOW UP: Our staff will call the number listed on your records the next business day following your procedure to check on you and address any questions or concerns that you may have regarding the information given to you following your procedure. If we do not reach you, we will leave a message.  However, if you are feeling well and you are not experiencing any problems, there is no need to return our call.  We will assume that you have returned to your regular daily activities without incident.  If any biopsies were taken you will be contacted by phone or by letter within the next 1-3 weeks.  Please call us at [phone] if you have not heard about the biopsies in 3 weeks.    SIGNATURES/CONFIDENTIALITY: You and/or your care partner have signed paperwork which will be entered into your electronic medical record.  These signatures attest to the fact that that the information above on your After Visit Summary has been reviewed and is understood.  Full responsibility of the confidentiality of this discharge information lies with you and/or your care-partner.   Resume medications. Information given on diverticulosis and high fiber diet. 

## 2014-09-08 NOTE — Op Note (Signed)
[place]  [address] [place], [id number]   COLONOSCOPY PROCEDURE REPORT  PATIENT: Maurice Hart, Maurice Hart  MR#: [id number] BIRTHDATE: 1955-07-07 , 81  yrs. old GENDER: male ENDOSCOPIST: Inda Castle, MD REFERRED MK:LKJZ Lauenstein, M.D. PROCEDURE DATE:  09/08/2014 PROCEDURE:   Colonoscopy, screening First Screening Colonoscopy - Avg.  risk and is 50 yrs.  old or older - No.  Prior Negative Screening - Now for repeat screening. Less than 10 yrs Prior Negative Screening - Now for repeat screening.  Above average risk  History of Adenoma - Now for follow-up colonoscopy & has been > or = to 3 yrs.  N/A  Recommend repeat exam, <10 yrs? Yes ASA CLASS:   Class II INDICATIONS:FH Colon or Rectal Adenocarcinoma. MEDICATIONS: Monitored anesthesia care and Propofol 200 mg IV  DESCRIPTION OF PROCEDURE:   After the risks benefits and alternatives of the procedure were thoroughly explained, informed consent was obtained.  The digital rectal exam revealed no abnormalities of the rectum.   The [id number]  endoscope was introduced through the anus and advanced to the cecum, which was identified by both the appendix and ileocecal valve. No adverse events experienced.   The quality of the prep was (Suprep was used) excellent.  The instrument was then slowly withdrawn as the colon was fully examined. Estimated blood loss is zero unless otherwise noted in this procedure report.      COLON FINDINGS: There was moderate diverticulosis noted in the descending colon and sigmoid colon.  Retroflexed views revealed no abnormalities. The time to cecum = 2.1 Withdrawal time = 10.7   The scope was withdrawn and the procedure completed. COMPLICATIONS: There were no immediate complications.  ENDOSCOPIC IMPRESSION: Moderate diverticulosis was noted in the descending colon and sigmoid colon  RECOMMENDATIONS: Given your significant family history of colon cancer, you should have a  repeat colonoscopy in 5 years  eSigned:  Inda Castle, MD 09/08/2014 11:39 AM   cc:

## 2014-09-08 NOTE — Progress Notes (Signed)
To recovery, report to Brown, RN, VSS. 

## 2014-09-09 ENCOUNTER — Telehealth: Payer: Self-pay | Admitting: *Deleted

## 2014-09-09 NOTE — Telephone Encounter (Signed)
  Follow up Call-  Call back number 09/08/2014  Post procedure Call Back phone  # [phone]   Permission to leave phone message Yes     Patient questions:  Do you have a fever, pain , or abdominal swelling? No. Pain Score  0 *  Have you tolerated food without any problems? Yes.    Have you been able to return to your normal activities? Yes.    Do you have any questions about your discharge instructions: Diet   No. Medications  No. Follow up visit  No.  Do you have questions or concerns about your Care? No.  Actions: * If pain score is 4 or above: No action needed, pain <4.

## 2014-09-17 ENCOUNTER — Ambulatory Visit (INDEPENDENT_AMBULATORY_CARE_PROVIDER_SITE_OTHER): Payer: BLUE CROSS/BLUE SHIELD

## 2014-09-17 ENCOUNTER — Ambulatory Visit (INDEPENDENT_AMBULATORY_CARE_PROVIDER_SITE_OTHER): Payer: BLUE CROSS/BLUE SHIELD | Admitting: Family Medicine

## 2014-09-17 VITALS — BP 130/82 | HR 83 | Temp 97.8°F | Resp 20 | Ht 72.0 in | Wt 251.4 lb

## 2014-09-17 DIAGNOSIS — E860 Dehydration: Secondary | ICD-10-CM

## 2014-09-17 DIAGNOSIS — R109 Unspecified abdominal pain: Secondary | ICD-10-CM

## 2014-09-17 DIAGNOSIS — R10A0 Flank pain, unspecified side: Secondary | ICD-10-CM

## 2014-09-17 DIAGNOSIS — N200 Calculus of kidney: Secondary | ICD-10-CM | POA: Diagnosis not present

## 2014-09-17 LAB — POCT URINALYSIS DIPSTICK
Bilirubin, UA: NEGATIVE
Blood, UA: NEGATIVE
Glucose, UA: NEGATIVE
Ketones, UA: NEGATIVE
LEUKOCYTES UA: NEGATIVE
Nitrite, UA: NEGATIVE
PH UA: 5
PROTEIN UA: NEGATIVE
Spec Grav, UA: >=1.03
UROBILINOGEN UA: 0.2

## 2014-09-17 LAB — POCT UA - MICROSCOPIC ONLY
Crystals, Ur, HPF, POC: NEGATIVE
YEAST UA: NEGATIVE

## 2014-09-17 MED ORDER — TAMSULOSIN HCL 0.4 MG PO CAPS: 0.4000 mg | ORAL_CAPSULE | Freq: Every day | ORAL | Status: DC

## 2014-09-17 MED ORDER — OXYCODONE-ACETAMINOPHEN 5-325 MG PO TABS: 0.5000 | ORAL_TABLET | Freq: Four times a day (QID) | ORAL | Status: DC | PRN

## 2014-09-17 MED ORDER — ONDANSETRON 8 MG PO TBDP: 8.0000 mg | ORAL_TABLET | Freq: Three times a day (TID) | ORAL | Status: DC | PRN

## 2014-09-17 NOTE — Progress Notes (Signed)
MRN: [id number] DOB: 1955/12/04  Subjective:   Maurice Hart is a 59 y.o. male presenting for chief complaint of Flank Pain  Reports 2 day history of left flank pain that progressed to sharp constant stabbing pain. Has Alleve tried with minimal relief. Has a history of kidney stone in 2011, this episode of pain feels similar but worse in intensity. Denies fever, n/v, abdominal pain, dysuria, hematuria, constipation, back pain. 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 Denies any other aggravating or relieving factors, no other questions or concerns.  Maurice Hart has a current medication list which includes the following prescription(s): aspirin, multivitamin, and fluticasone. Also is allergic to erythromycin.  Maurice Hart  has a past medical history of Allergy. Also  has past surgical history that includes Vasectomy ([id number]) and Colonoscopy.  Objective:   Vitals: BP 130/82 mmHg  Pulse 83  Temp(Src) 97.8 F (36.6 C) (Oral)  Resp 20  Ht 6' (1.829 m)  Wt 251 lb 6 oz (114.023 kg)  BMI 34.09 kg/m2  SpO2 98%  Physical Exam  Constitutional: He is oriented to person, place, and time. He appears well-developed and well-nourished.  Cardiovascular: Normal rate, regular rhythm and intact distal pulses.  Exam reveals no gallop and no friction rub.   No murmur heard. Pulmonary/Chest: No respiratory distress. He has no wheezes. He has no rales.  Abdominal: Soft. Bowel sounds are normal. He exhibits no distension and no mass. There is no tenderness.  No CVA tenderness.  Neurological: He is alert and oriented to person, place, and time.  Skin: Skin is warm and dry. No rash noted. No erythema. No pallor.  Psychiatric: He has a normal mood and affect.   Results for orders placed or performed in visit on 09/17/14 (from the past 24 hour(s))  POCT UA - Microscopic Only     Status: None   Collection Time: 09/17/14  7:11 PM    Result Value Ref Range   WBC, Ur, HPF, POC 2-5    RBC, urine, microscopic 1-3    Bacteria, U Microscopic trace    Mucus, UA 1+    Epithelial cells, urine per micros 2-4    Crystals, Ur, HPF, POC neg    Casts, Ur, LPF, POC neg    Yeast, UA neg   POCT urinalysis dipstick     Status: None   Collection Time: 09/17/14  7:11 PM  Result Value Ref Range   Color, UA yellow    Clarity, UA clear    Glucose, UA neg    Bilirubin, UA neg    Ketones, UA neg    Spec Grav, UA >=1.030    Blood, UA neg    pH, UA 5.0    Protein, UA neg    Urobilinogen, UA 0.2    Nitrite, UA neg    Leukocytes, UA Negative Negative   [facility] reading (PRIMARY) by  Dr. Marin Comment and PA-Gwynne Kemnitz. KUB - normal.  Assessment and Plan :   1. Nephrolithiasis 2. Flank pain - Likely renal stone, counseled on worsening signs and symptoms and instructed patient to rtc if anuria, fever, n/v, abdominal pain. - Percocet for pain, Zofran for n/v in case this develops as he has had this before with pain medication. - Tamsulosin, aggressive hydration for passage of suspected kidney stone - POCT UA - Microscopic Only - POCT urinalysis dipstick - Basic metabolic panel - DG Abd 1 View; Future  3. Dehydration - advised adequate hydration daily, IV fluids today.  Jaynee Eagles, PA-C [facility] [phone] 09/17/2014 7:10 PM

## 2014-09-17 NOTE — Patient Instructions (Signed)

## 2014-09-18 LAB — BASIC METABOLIC PANEL
BUN: 20 mg/dL (ref 7–25)
CALCIUM: 8.9 mg/dL (ref 8.6–10.3)
CO2: 23 mmol/L (ref 20–31)
Chloride: 109 mmol/L (ref 98–110)
Creat: 1.21 mg/dL (ref 0.70–1.33)
Glucose, Bld: 99 mg/dL (ref 65–99)
Potassium: 4 mmol/L (ref 3.5–5.3)
SODIUM: 138 mmol/L (ref 135–146)

## 2014-09-22 NOTE — Progress Notes (Signed)
Agree with A/P. Note reviewed. Dr Marin Comment

## 2014-12-01 ENCOUNTER — Encounter: Payer: Self-pay | Admitting: Gastroenterology

## 2015-05-01 ENCOUNTER — Ambulatory Visit (INDEPENDENT_AMBULATORY_CARE_PROVIDER_SITE_OTHER): Payer: BLUE CROSS/BLUE SHIELD | Admitting: Family Medicine

## 2015-05-01 VITALS — BP 122/70 | HR 80 | Temp 98.3°F | Resp 16 | Ht 72.0 in | Wt 258.0 lb

## 2015-05-01 DIAGNOSIS — H1013 Acute atopic conjunctivitis, bilateral: Secondary | ICD-10-CM

## 2015-05-01 MED ORDER — AZELASTINE HCL 0.05 % OP SOLN: 1.0000 [drp] | Freq: Two times a day (BID) | OPHTHALMIC | Status: DC

## 2015-05-01 NOTE — Patient Instructions (Addendum)
     IF you received an x-ray today, you will receive an invoice from [facility]. Please contact [facility] at [phone] with questions or concerns regarding your invoice.   IF you received labwork today, you will receive an invoice from [employer]. Please contact [facility] at [phone] with questions or concerns regarding your invoice.   Our billing staff will not be able to assist you with questions regarding bills from these companies.  You will be contacted with the lab results as soon as they are available. The fastest way to get your results is to activate your My Chart account. Instructions are located on the last page of this paperwork. If you have not heard from [place] regarding the results in 2 weeks, please contact this office.    Start Optivar for suspected allergic conjunctivitis.  You can also use over the counter flonase nasal spray if needed. Return to the clinic or go to the nearest emergency room if any of your symptoms worsen or new symptoms occur.  Allergic Conjunctivitis Allergic conjunctivitis is inflammation of the clear membrane that covers the white part of your eye and the inner surface of your eyelid (conjunctiva), and it is caused by allergies. The blood vessels in the conjunctiva become inflamed, and this causes the eye to become red or pink, and it often causes itchiness in the eye. Allergic conjunctivitis cannot be spread by one person to another person (noncontagious). CAUSES This condition is caused by an allergic reaction. Common causes of an allergic reaction (allergens) include:  Dust.  Pollen.  Mold.  Animal dander or secretions. RISK FACTORS This condition is more likely to develop if you are exposed to high levels of allergens that cause the allergic reaction. This might include being outdoors when air pollen levels are high or being around animals that you are allergic to. SYMPTOMS Symptoms of this  condition may include:  Eye redness.  Tearing of the eyes.  Watery eyes.  Itchy eyes.  Burning feeling in the eyes.  Clear drainage from the eyes.  Swollen eyelids. DIAGNOSIS This condition may be diagnosed by medical history and physical exam. If you have drainage from your eyes, it may be tested to rule out other causes of conjunctivitis. TREATMENT Treatment for this condition often includes medicines. These may be eye drops, ointments, or oral medicines. They may be prescription medicines or over-the-counter medicines. HOME CARE INSTRUCTIONS  Take or apply medicines only as directed by your health care provider.  Do not touch or rub your eyes.  Do not wear contact lenses until the inflammation is gone. Wear glasses instead.  Do not wear eye makeup until the inflammation is gone.  Apply a cool, clean washcloth to your eye for 10-20 minutes, 3-4 times a day.  Try to avoid whatever allergen is causing the allergic reaction. SEEK MEDICAL CARE IF:  Your symptoms get worse.  You have pus draining from your eye.  You have new symptoms.  You have a fever.   This information is not intended to replace advice given to you by your health care provider. Make sure you discuss any questions you have with your health care provider.   Document Released: 04/09/2002 Document Revised: 02/07/2014 Document Reviewed: 10/29/2013 Elsevier Interactive Patient Education [employer].

## 2015-05-01 NOTE — Progress Notes (Signed)
Subjective:  This chart was scribed for [name] MD, by Tamsen Roers, at [facility].  This patient was seen in room 9 and the patient's care was started at 8:30 AM.   Chief Complaint  Patient presents with  . Eye Problem    Eye irritation from pollen, allergies     Patient ID: Maurice Hart, male    DOB: September 02, 1955, 60 y.o.   MRN: [id number]  HPI HPI Comments: Maurice Hart is a 60 y.o. male who presents to the [facility] complaining of swelling in his eyes bilaterally onset last night.  He has been prescribed eye drops in the past by Dr. Joseph Art and was prescribed Optivar drops by [facility] in 2013.  He states that these drops had an immediate effect for him and gave him relief.  Patient states that the pollen around this time of year always seems to give him swelling and redness in his eyes.  He tried taking Benadryl last night for relief and feels that his symptoms are much better today compared to yesterday.  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 Patient is a Pharmacist, hospital at [employer] ([employer]  He denies any congestion, fever/chills or runny nose. No known foreign body or injury.    Patient Active Problem List   Diagnosis Date Noted  . Flank pain 09/17/2014  . Nephrolithiasis 09/17/2014   Past Medical History  Diagnosis Date  . Allergy    Past Surgical History  Procedure Laterality Date  . Vasectomy  [id number]  . Colonoscopy      tics   Allergies  Allergen Reactions  . Erythromycin Diarrhea   Prior to Admission medications   Medication Sig Start Date End Date Taking? Authorizing Provider  aspirin 81 MG tablet Take 81 mg by mouth daily.   Yes Historical Provider, MD  Multiple Vitamin (MULTIVITAMIN) tablet Take 1 tablet by mouth daily.   Yes Historical Provider, MD   Social History   Social History    . Marital Status: Married    Spouse Name: N/A  . Number of Children: N/A  . Years of Education: N/A   Occupational History  . professor [employer]   Social History Main Topics  . Smoking status: Never Smoker   . Smokeless tobacco: Never Used  . Alcohol Use: 1.2 oz/week    1 Glasses of wine, 1 Cans of beer per week     Comment: 2-4 drinks  . Drug Use: No  . Sexual Activity: Not on file   Other Topics Concern  . Not on file   Social History Narrative   Married. Exercise: Yes. Education: College/Other.       Review of Systems  Constitutional: Negative for fever and chills.  HENT: Negative for congestion.   Eyes: Positive for redness and itching.  Respiratory: Negative for cough and choking.   Gastrointestinal: Negative for nausea and vomiting.  Musculoskeletal: Negative for neck pain and neck stiffness.  Neurological: Negative for syncope and speech difficulty.       Objective:   Physical Exam  Constitutional: He is oriented to person, place, and time. He appears well-developed and well-nourished. No distress.  HENT:  Head: Normocephalic and atraumatic.  Right Ear: Tympanic membrane, external ear and ear canal normal.  Left Ear: Tympanic membrane, external ear and ear canal normal.  Nose: No  rhinorrhea.  Mouth/Throat: Oropharynx is clear and moist and mucous membranes are normal. No oropharyngeal exudate or posterior oropharyngeal erythema.  Eyes: EOM are normal. Pupils are equal, round, and reactive to light.  Diffuse scleral injection bilaterally There is some slight edema of the lateral conjunctiva of the left eye.   Neck: Neck supple.  No pre auricular lymphadenopathy.   Cardiovascular: Normal rate, regular rhythm, normal heart sounds and intact distal pulses.   No murmur heard. Pulmonary/Chest: Effort normal and breath sounds normal. No respiratory distress. He has no wheezes. He has no rhonchi. He has no rales.  Abdominal: Soft. There is no  tenderness.  Musculoskeletal: Normal range of motion.  Lymphadenopathy:    He has no cervical adenopathy.  Neurological: He is alert and oriented to person, place, and time.  Skin: Skin is warm and dry. No rash noted.  Psychiatric: He has a normal mood and affect. His behavior is normal.  Nursing note and vitals reviewed.  Filed Vitals:   05/01/15 0811  BP: 122/70  Pulse: 80  Temp: 98.3 F (36.8 C)  TempSrc: Oral  Resp: 16  Height: 6' (1.829 m)  Weight: 258 lb (117.028 kg)  SpO2: 97%     Visual Acuity Screening   Right eye Left eye Both eyes  Without correction:     With correction: 20/25 20/25 20/25        Assessment & Plan:   Maurice Hart is a 60 y.o. male Allergic conjunctivitis, bilateral - Plan: azelastine (OPTIVAR) 0.05 % ophthalmic solution  - restart Optivar, can add flonase ns if needed. rtc precautions if worsening.    Meds ordered this encounter  Medications  . azelastine (OPTIVAR) 0.05 % ophthalmic solution    Sig: Place 1 drop into both eyes 2 (two) times daily.    Dispense:  6 mL    Refill:  6   Patient Instructions       IF you received an x-ray today, you will receive an invoice from [facility]. Please contact [facility] at [phone] with questions or concerns regarding your invoice.   IF you received labwork today, you will receive an invoice from [employer]. Please contact [facility] at [phone] with questions or concerns regarding your invoice.   Our billing staff will not be able to assist you with questions regarding bills from these companies.  You will be contacted with the lab results as soon as they are available. The fastest way to get your results is to activate your My Chart account. Instructions are located on the last page of this paperwork. If you have not heard from [place] regarding the results in 2 weeks, please contact this office.    Start Optivar for suspected allergic  conjunctivitis.  You can also use over the counter flonase nasal spray if needed. Return to the clinic or go to the nearest emergency room if any of your symptoms worsen or new symptoms occur.  Allergic Conjunctivitis Allergic conjunctivitis is inflammation of the clear membrane that covers the white part of your eye and the inner surface of your eyelid (conjunctiva), and it is caused by allergies. The blood vessels in the conjunctiva become inflamed, and this causes the eye to become red or pink, and it often causes itchiness in the eye. Allergic conjunctivitis cannot be spread by one person to another person (noncontagious). CAUSES This condition is caused by an allergic reaction. Common causes of an allergic reaction (allergens) include:  Dust.  Pollen.  Mold.  Animal dander or secretions. RISK FACTORS This condition is more likely to develop if you are exposed to high levels of allergens that cause the allergic reaction. This might include being outdoors when air pollen levels are high or being around animals that you are allergic to. SYMPTOMS Symptoms of this condition may include:  Eye redness.  Tearing of the eyes.  Watery eyes.  Itchy eyes.  Burning feeling in the eyes.  Clear drainage from the eyes.  Swollen eyelids. DIAGNOSIS This condition may be diagnosed by medical history and physical exam. If you have drainage from your eyes, it may be tested to rule out other causes of conjunctivitis. TREATMENT Treatment for this condition often includes medicines. These may be eye drops, ointments, or oral medicines. They may be prescription medicines or over-the-counter medicines. HOME CARE INSTRUCTIONS  Take or apply medicines only as directed by your health care provider.  Do not touch or rub your eyes.  Do not wear contact lenses until the inflammation is gone. Wear glasses instead.  Do not wear eye makeup until the inflammation is gone.  Apply a cool, clean  washcloth to your eye for 10-20 minutes, 3-4 times a day.  Try to avoid whatever allergen is causing the allergic reaction. SEEK MEDICAL CARE IF:  Your symptoms get worse.  You have pus draining from your eye.  You have new symptoms.  You have a fever.   This information is not intended to replace advice given to you by your health care provider. Make sure you discuss any questions you have with your health care provider.   Document Released: 04/09/2002 Document Revised: 02/07/2014 Document Reviewed: 10/29/2013 Elsevier Interactive Patient Education [employer].     I personally performed the services described in this documentation, which was scribed in my presence. The recorded information has been reviewed and considered, and addended by me as needed.

## 2015-09-01 ENCOUNTER — Ambulatory Visit (INDEPENDENT_AMBULATORY_CARE_PROVIDER_SITE_OTHER): Payer: BLUE CROSS/BLUE SHIELD | Admitting: Family Medicine

## 2015-09-01 VITALS — BP 122/72 | HR 81 | Temp 98.4°F | Resp 17 | Ht 72.5 in | Wt 258.0 lb

## 2015-09-01 DIAGNOSIS — Z125 Encounter for screening for malignant neoplasm of prostate: Secondary | ICD-10-CM

## 2015-09-01 DIAGNOSIS — Z808 Family history of malignant neoplasm of other organs or systems: Secondary | ICD-10-CM

## 2015-09-01 DIAGNOSIS — Z0000 Encounter for general adult medical examination without abnormal findings: Secondary | ICD-10-CM

## 2015-09-01 DIAGNOSIS — D2230 Melanocytic nevi of unspecified part of face: Secondary | ICD-10-CM

## 2015-09-01 DIAGNOSIS — Z1159 Encounter for screening for other viral diseases: Secondary | ICD-10-CM | POA: Diagnosis not present

## 2015-09-01 DIAGNOSIS — Z23 Encounter for immunization: Secondary | ICD-10-CM

## 2015-09-01 DIAGNOSIS — R739 Hyperglycemia, unspecified: Secondary | ICD-10-CM | POA: Diagnosis not present

## 2015-09-01 DIAGNOSIS — Z13220 Encounter for screening for lipoid disorders: Secondary | ICD-10-CM

## 2015-09-01 DIAGNOSIS — H1013 Acute atopic conjunctivitis, bilateral: Secondary | ICD-10-CM | POA: Diagnosis not present

## 2015-09-01 DIAGNOSIS — Z131 Encounter for screening for diabetes mellitus: Secondary | ICD-10-CM | POA: Diagnosis not present

## 2015-09-01 DIAGNOSIS — Z130 Encounter for screening for diseases of the blood and blood-forming organs and certain disorders involving the immune mechanism: Secondary | ICD-10-CM

## 2015-09-01 DIAGNOSIS — H9190 Unspecified hearing loss, unspecified ear: Secondary | ICD-10-CM

## 2015-09-01 DIAGNOSIS — D2239 Melanocytic nevi of other parts of face: Secondary | ICD-10-CM | POA: Diagnosis not present

## 2015-09-01 LAB — COMPLETE METABOLIC PANEL WITH GFR
ALT: 21 U/L (ref 9–46)
AST: 19 U/L (ref 10–35)
Albumin: 4.4 g/dL (ref 3.6–5.1)
Alkaline Phosphatase: 54 U/L (ref 40–115)
BUN: 17 mg/dL (ref 7–25)
CO2: 18 mmol/L — ABNORMAL LOW (ref 20–31)
Calcium: 9.4 mg/dL (ref 8.6–10.3)
Chloride: 108 mmol/L (ref 98–110)
Creat: 1.25 mg/dL (ref 0.70–1.25)
GFR, Est African American: 72 mL/min (ref >=60)
GFR, Est Non African American: 62 mL/min (ref >=60)
Glucose, Bld: 101 mg/dL — ABNORMAL HIGH (ref 65–99)
Potassium: 4.1 mmol/L (ref 3.5–5.3)
Sodium: 139 mmol/L (ref 135–146)
Total Bilirubin: 0.8 mg/dL (ref 0.2–1.2)
Total Protein: 6.9 g/dL (ref 6.1–8.1)

## 2015-09-01 LAB — CBC
HEMATOCRIT: 45.6 % (ref 38.5–50.0)
HEMOGLOBIN: 15.6 g/dL (ref 13.2–17.1)
MCH: 29.2 pg (ref 27.0–33.0)
MCHC: 34.2 g/dL (ref 32.0–36.0)
MCV: 85.2 fL (ref 80.0–100.0)
MPV: 9.5 fL (ref 7.5–12.5)
Platelets: 215 10*3/uL (ref 140–400)
RBC: 5.35 MIL/uL (ref 4.20–5.80)
RDW: 13.6 % (ref 11.0–15.0)
WBC: 5.3 10*3/uL (ref 3.8–10.8)

## 2015-09-01 LAB — LIPID PANEL
CHOL/HDL RATIO: 5.5 ratio — AB (ref <=5.0)
CHOLESTEROL: 181 mg/dL (ref 125–200)
HDL: 33 mg/dL — AB (ref >=40)
LDL Cholesterol: 115 mg/dL (ref <130)
Triglycerides: 167 mg/dL — ABNORMAL HIGH (ref <150)
VLDL: 33 mg/dL — ABNORMAL HIGH (ref <30)

## 2015-09-01 LAB — HEMOGLOBIN A1C
Hgb A1c MFr Bld: 6.4 % — ABNORMAL HIGH (ref <5.7)
Mean Plasma Glucose: 137 mg/dL

## 2015-09-01 MED ORDER — ZOSTER VACCINE LIVE 19400 UNT/0.65ML ~~LOC~~ SUSR: 0.6500 mL | Freq: Once | SUBCUTANEOUS | 0 refills | Status: AC

## 2015-09-01 NOTE — Progress Notes (Signed)
Subjective:  By signing my name below, I, Maurice Hart, attest that this documentation has been prepared under the direction and in the presence of Maurice Ray, MD. Electronically Signed: [name], [place]. 09/01/2015 , 2:33 PM .  Patient was seen in Room 1 .   Patient ID: Maurice Hart, male    DOB: Mar 30, 1955, 60 y.o.   MRN: [id number] Chief Complaint  Patient presents with  . Annual Exam   HPI Maurice Hart is a 60 y.o. male He has a history of nephrolithiasis, obesity/overweight, and pre-DM. Here for annual physical. Last physical was in June 2016.   Allergic conjunctivitis Patient states the leaves have been falling early and believes eye redness caused by allergies. He still has some Optivar when he was seen for allergic conjunctivitis in March.   Mole - left cheek Patient notes having a mole over his left lower cheek over his beard. His wife first noticed it a few months ago, and mentions it has grown a little bigger. He has family history of skin cancer, his sister and mother. He denies history of skin cancer. He denies being seen by dermatologist about this.   Cancer Screening Last colonoscopy in August 2016. Moderate diverticulosis, recommended repeat in 5 years due to family history of colon cancer. His oldest brother has history of colon cancer at age of 82.   Prostate cancer screening:  Lab Results  Component Value Date   PSA 1.49 07/12/2014   PSA 1.50 01/19/2012   He believes his father had enlarged prostate, but at an older age.   Immunizations Immunization History  Administered Date(s) Administered  . Influenza Split 12/20/2011  . Td 02/01/2004   Will discuss shingles vaccine today.  Will repeat Tdap today. He believes his last Tetanus was done 4 years ago, but he works around Engineer, site at [employer], so he agrees to [employer] today.   Depression Depression screen [facility] 2/9 09/01/2015 05/01/2015 09/17/2014 07/12/2014 04/25/2013    Decreased Interest 0 0 0 0 0  Down, Depressed, Hopeless 0 0 0 0 0  PHQ - 2 Score 0 0 0 0 0    Vision  Visual Acuity Screening   Right eye Left eye Both eyes  Without correction:     With correction: 20/30 20/40 20/30    He last saw an eye doctor a year ago and was informed early stage of cataract on the right side.   Dentist He sees Mirna Mires for annual teeth cleaning.   Exercise He goes to "[employer]" every other day, and works out about 50 minutes to an hour each time.   Hyperglycemia Wt Readings from Last 3 Encounters:  09/01/15 258 lb (117 kg)  05/01/15 258 lb (117 kg)  09/17/14 251 lb 6 oz (114 kg)   Body mass index is 34.51 kg/m.   He eats breakfast daily. He rarely drinks sweet tea or carbonated beverages. He drinks an occasional beer, about 2~3 beers over a week.   Advanced directives Patient has a living will. He will forward a copy to our office.   Decreased Hearing He also mentions his son informed him having some decreased hearing. But, he believes his hearing is okay.   HIV screening He declined testing.   Hep C He hasn't had this yet, but will check today.   Patient Active Problem List   Diagnosis Date Noted  . Flank pain 09/17/2014  . Nephrolithiasis 09/17/2014   Past Medical History:  Diagnosis Date  .  Allergy    Past Surgical History:  Procedure Laterality Date  . COLONOSCOPY     tics  . VASECTOMY  [id number]   Allergies  Allergen Reactions  . Erythromycin Diarrhea   Prior to Admission medications   Medication Sig Start Date End Date Taking? Authorizing Provider  aspirin 81 MG tablet Take 81 mg by mouth daily.   Yes Historical Provider, MD  azelastine (OPTIVAR) 0.05 % ophthalmic solution Place 1 drop into both eyes 2 (two) times daily. 05/01/15  Yes Wendie Agreste, MD  Multiple Vitamin (MULTIVITAMIN) tablet Take 1 tablet by mouth daily.   Yes Historical Provider, MD   Social History   Social History  . Marital status: Married     Spouse name: N/A  . Number of children: N/A  . Years of education: N/A   Occupational History  . professor [employer]   Social History Main Topics  . Smoking status: Never Smoker  . Smokeless tobacco: Never Used  . Alcohol use 1.2 oz/week    1 Glasses of wine, 1 Cans of beer per week     Comment: 2-4 drinks  . Drug use: No  . Sexual activity: No   Other Topics Concern  . Not on file   Social History Narrative   Married. Exercise: Yes. Education: College/Other.   Review of Systems  All other systems reviewed and are negative.  13 point ROS - positive for eye redness, eye swelling, mole     Objective:   Physical Exam  Constitutional: He is oriented to person, place, and time. He appears well-developed and well-nourished.  HENT:  Head: Normocephalic and atraumatic.  Right Ear: External ear normal.  Left Ear: External ear normal.  Mouth/Throat: Oropharynx is clear and moist.  Left angle of jaw: there's an approximately 6 mm slightly pigmented area with 1 darker area 2 mm posteriorly  Eyes: Conjunctivae and EOM are normal. Pupils are equal, round, and reactive to light.  Minimal sclera injection bilateral eyes, no discharge  Neck: Normal range of motion. Neck supple. No thyromegaly present.  Cardiovascular: Normal rate, regular rhythm, normal heart sounds and intact distal pulses.   Pulmonary/Chest: Effort normal and breath sounds normal. No respiratory distress. He has no wheezes.  Abdominal: Soft. He exhibits no distension. There is no tenderness. Hernia confirmed negative in the right inguinal area and confirmed negative in the left inguinal area.  Genitourinary: Prostate normal.  Musculoskeletal: Normal range of motion. He exhibits no edema or tenderness.  Lymphadenopathy:    He has no cervical adenopathy.  Neurological: He is alert and oriented to person, place, and time. He has normal reflexes.  Skin: Skin is warm and dry.  Psychiatric: He has a normal mood  and affect. His behavior is normal.  Vitals reviewed.   Vitals:   09/01/15 1355  BP: 122/72  Pulse: 81  Resp: 17  Temp: 98.4 F (36.9 C)  TempSrc: Oral  SpO2: 97%  Weight: 258 lb (117 kg)  Height: 6' 0.5" (1.842 m)      Assessment & Plan:   [name] is a 60 y.o. male Annual physical exam  --anticipatory guidance as below in AVS, screening labs above. Health maintenance items as above in HPI discussed/recommended as applicable.   Hyperglycemia - Plan: Hemoglobin A1C Screening for diabetes mellitus - Plan: COMPLETE METABOLIC PANEL WITH GFR, Hemoglobin A1C  -Exercise, diet/portion control discussed.  Screening for hyperlipidemia - Plan: Lipid panel  Need for hepatitis C screening test - Plan:  Hepatitis C antibody  Family history of skin cancer Nevus of face - Plan: Ambulatory referral to Dermatology  -Few small darkened areas posterior to maintain nevus. Derm to eval further.  Decreased hearing, unspecified laterality  -Grossly appears to be intact during visit, but recommended screening exam. Can have this done outside of office, or return for audiogram if needed.  Screening, anemia, deficiency, iron - Plan: CBC  Screening for prostate cancer - Plan: PSA  -We discussed pros and cons of prostate cancer screening, and after this discussion, he chose to have screening done. PSA obtained, and no concerning findings on DRE.   Need for shingles vaccine - Plan: Zoster Vaccine Live, PF, (ZOSTAVAX) 91478 UNT/0.65ML injection printed to fill at his pharmacy  Allergic conjunctivitis, bilateral  -restart optivar  Need for Tdap vaccination - Plan: Tdap vaccine greater than or equal to 7yo IM given   Meds ordered this encounter  Medications  . Zoster Vaccine Live, PF, (ZOSTAVAX) 29562 UNT/0.65ML injection    Sig: Inject 19,400 Units into the skin once.    Dispense:  1 each    Refill:  0   Patient Instructions    See information below regarding your physical today. I  will refer you to dermatology for the area on the side of your face. I also included information below on a diet for prediabetes or elevated Hart sugar. Continue exercise most days of the week if possible. Recheck in the next 3-6 months to recheck the Hart sugar level. If any changes in this plan based on your Hart work, I will let you know.  If you feel you're hearing is decreased, can return to discuss it further, or have a hearing screen done outside of the office at your convenience. Let me know if that worsens in the meantime.  For your eyes, restart the Optivar as previously prescribed, and if not improving, return here or your eye care provider.   IF you received an x-Hart today, you will receive an invoice from [facility]. Please contact [facility] at [phone] with questions or concerns regarding your invoice.   IF you received labwork today, you will receive an invoice from [employer]. Please contact [facility] at [phone] with questions or concerns regarding your invoice.   Our billing staff will not be able to assist you with questions regarding bills from these companies.  You will be contacted with the lab results as soon as they are available. The fastest way to get your results is to activate your My Chart account. Instructions are located on the last page of this paperwork. If you have not heard from [place] regarding the results in 2 weeks, please contact this office.    Keeping you healthy  Get these tests  Hart pressure- Have your Hart pressure checked once a year by your healthcare provider.  Normal Hart pressure is 120/80  Weight- Have your body mass index (BMI) calculated to screen for obesity.  BMI is a measure of body fat based on height and weight. You can also calculate your own BMI at [email or web address].  Cholesterol- Have your cholesterol checked every year.  Diabetes- Have your Hart sugar checked  regularly if you have high Hart pressure, high cholesterol, have a family history of diabetes or if you are overweight.  Screening for Colon Cancer- Colonoscopy starting at age 28.  Screening may begin sooner depending on your family history and other health conditions. Follow up colonoscopy as directed by your Gastroenterologist.  Screening  for Prostate Cancer- Both Hart work (PSA) and a rectal exam help screen for Prostate Cancer.  Screening begins at age 84 with African-American men and at age 57 with Caucasian men.  Screening may begin sooner depending on your family history.  Take these medicines  Aspirin- One aspirin daily can help prevent Heart disease and Stroke.  Flu shot- Every fall.  Tetanus- Every 10 years.  Zostavax- Once after the age of 39 to prevent Shingles.  Pneumonia shot- Once after the age of 70; if you are younger than 19, ask your healthcare provider if you need a Pneumonia shot.  Take these steps  Don't smoke- If you do smoke, talk to your doctor about quitting.  For tips on how to quit, go to www.smokefree.gov or call 1-800-QUIT-NOW.  Be physically active- Exercise 5 days a week for at least 30 minutes.  If you are not already physically active start slow and gradually work up to 30 minutes of moderate physical activity.  Examples of moderate activity include walking briskly, mowing the yard, dancing, swimming, bicycling, etc.  Eat a healthy diet- Eat a variety of healthy food such as fruits, vegetables, low fat milk, low fat cheese, yogurt, lean meant, poultry, fish, beans, tofu, etc. For more information go to www.thenutritionsource.org  Drink alcohol in moderation- Limit alcohol intake to less than two drinks a day. Never drink and drive.  Dentist- Brush and floss twice daily; visit your dentist twice a year.  Depression- Your emotional health is as important as your physical health. If you're feeling down, or losing interest in things you would normally  enjoy please talk to your healthcare provider.  Eye exam- Visit your eye doctor every year.  Safe sex- If you may be exposed to a sexually transmitted infection, use a condom.  Seat belts- Seat belts can save your life; always wear one.  Smoke/Carbon Monoxide detectors- These detectors need to be installed on the appropriate level of your home.  Replace batteries at least once a year.  Skin cancer- When out in the sun, cover up and use sunscreen 15 SPF or higher.  Violence- If anyone is threatening you, please tell your healthcare provider.  Living Will/ Health care power of attorney- Speak with your healthcare provider and family. Prediabetes Eating Plan Prediabetes--also called impaired glucose tolerance or impaired fasting glucose--is a condition that causes Hart sugar (Hart glucose) levels to be higher than normal. Following a healthy diet can help to keep prediabetes under control. It can also help to lower the risk of type 2 diabetes and heart disease, which are increased in people who have prediabetes. Along with regular exercise, a healthy diet:  Promotes weight loss.  Helps to control Hart sugar levels.  Helps to improve the way that the body uses insulin. WHAT DO I NEED TO KNOW ABOUT THIS EATING PLAN?  Use the glycemic index (GI) to plan your meals. The index tells you how quickly a food will raise your Hart sugar. Choose low-GI foods. These foods take a longer time to raise Hart sugar.  Pay close attention to the amount of carbohydrates in the food that you eat. Carbohydrates increase Hart sugar levels.  Keep track of how many calories you take in. Eating the right amount of calories will help you to achieve a healthy weight. Losing about 7 percent of your starting weight can help to prevent type 2 diabetes.  You may want to follow a Mediterranean diet. This diet includes a lot of vegetables, lean  meats or fish, whole grains, fruits, and healthy oils and fats. WHAT  FOODS CAN I EAT? Grains Whole grains, such as whole-wheat or whole-grain breads, crackers, cereals, and pasta. Unsweetened oatmeal. Bulgur. Barley. Quinoa. Brown rice. Corn or whole-wheat flour tortillas or taco shells. Vegetables Lettuce. Spinach. Peas. Beets. Cauliflower. Cabbage. Broccoli. Carrots. Tomatoes. Squash. Eggplant. Herbs. Peppers. Onions. Cucumbers. Brussels sprouts. Fruits Berries. Bananas. Apples. Oranges. Grapes. Papaya. Mango. Pomegranate. Kiwi. Grapefruit. Cherries. Meats and Other Protein Sources Seafood. Lean meats, such as chicken and [place] or lean cuts of pork and beef. Tofu. Eggs. Nuts. Beans. Dairy Low-fat or fat-free dairy products, such as yogurt, cottage cheese, and cheese. Beverages Water. Tea. Coffee. Sugar-free or diet soda. Seltzer water. Milk. Milk alternatives, such as soy or almond milk. Condiments Mustard. Relish. Low-fat, low-sugar ketchup. Low-fat, low-sugar barbecue sauce. Low-fat or fat-free mayonnaise. Sweets and Desserts Sugar-free or low-fat pudding. Sugar-free or low-fat ice cream and other frozen treats. Fats and Oils Avocado. Walnuts. Olive oil. The items listed above may not be a complete list of recommended foods or beverages. Contact your dietitian for more options.  WHAT FOODS ARE NOT RECOMMENDED? Grains Refined white flour and flour products, such as bread, pasta, snack foods, and cereals. Beverages Sweetened drinks, such as sweet iced tea and soda. Sweets and Desserts Baked goods, such as cake, cupcakes, pastries, cookies, and cheesecake. The items listed above may not be a complete list of foods and beverages to avoid. Contact your dietitian for more information.   This information is not intended to replace advice given to you by your health care provider. Make sure you discuss any questions you have with your health care provider.   Document Released: 06/03/2014 Document Reviewed: 06/03/2014 Elsevier Interactive Patient Education  [employer].    I personally performed the services described in this documentation, which was scribed in my presence. The recorded information has been reviewed and considered, and addended by me as needed.   Signed,   Maurice Ray, MD [facility].  09/02/15 8:26 AM

## 2015-09-01 NOTE — Patient Instructions (Addendum)
See information below regarding your physical today. I will refer you to dermatology for the area on the side of your face. I also included information below on a diet for prediabetes or elevated blood sugar. Continue exercise most days of the week if possible. Recheck in the next 3-6 months to recheck the blood sugar level. If any changes in this plan based on your blood work, I will let you know.  If you feel you're hearing is decreased, can return to discuss it further, or have a hearing screen done outside of the office at your convenience. Let me know if that worsens in the meantime.  For your eyes, restart the Optivar as previously prescribed, and if not improving, return here or your eye care provider.   IF you received an x-ray today, you will receive an invoice from [facility]. Please contact [facility] at [phone] with questions or concerns regarding your invoice.   IF you received labwork today, you will receive an invoice from [employer]. Please contact [facility] at [phone] with questions or concerns regarding your invoice.   Our billing staff will not be able to assist you with questions regarding bills from these companies.  You will be contacted with the lab results as soon as they are available. The fastest way to get your results is to activate your My Chart account. Instructions are located on the last page of this paperwork. If you have not heard from [place] regarding the results in 2 weeks, please contact this office.    Keeping you healthy  Get these tests  Blood pressure- Have your blood pressure checked once a year by your healthcare provider.  Normal blood pressure is 120/80  Weight- Have your body mass index (BMI) calculated to screen for obesity.  BMI is a measure of body fat based on height and weight. You can also calculate your own BMI at [email or web address].  Cholesterol- Have your cholesterol checked  every year.  Diabetes- Have your blood sugar checked regularly if you have high blood pressure, high cholesterol, have a family history of diabetes or if you are overweight.  Screening for Colon Cancer- Colonoscopy starting at age 56.  Screening may begin sooner depending on your family history and other health conditions. Follow up colonoscopy as directed by your Gastroenterologist.  Screening for Prostate Cancer- Both blood work (PSA) and a rectal exam help screen for Prostate Cancer.  Screening begins at age 51 with African-American men and at age 70 with Caucasian men.  Screening may begin sooner depending on your family history.  Take these medicines  Aspirin- One aspirin daily can help prevent Heart disease and Stroke.  Flu shot- Every fall.  Tetanus- Every 10 years.  Zostavax- Once after the age of 30 to prevent Shingles.  Pneumonia shot- Once after the age of 66; if you are younger than 11, ask your healthcare provider if you need a Pneumonia shot.  Take these steps  Don't smoke- If you do smoke, talk to your doctor about quitting.  For tips on how to quit, go to www.smokefree.gov or call 1-800-QUIT-NOW.  Be physically active- Exercise 5 days a week for at least 30 minutes.  If you are not already physically active start slow and gradually work up to 30 minutes of moderate physical activity.  Examples of moderate activity include walking briskly, mowing the yard, dancing, swimming, bicycling, etc.  Eat a healthy diet- Eat a variety of healthy food such as fruits, vegetables, low fat  milk, low fat cheese, yogurt, lean meant, poultry, fish, beans, tofu, etc. For more information go to www.thenutritionsource.org  Drink alcohol in moderation- Limit alcohol intake to less than two drinks a day. Never drink and drive.  Dentist- Brush and floss twice daily; visit your dentist twice a year.  Depression- Your emotional health is as important as your physical health. If you're feeling  down, or losing interest in things you would normally enjoy please talk to your healthcare provider.  Eye exam- Visit your eye doctor every year.  Safe sex- If you may be exposed to a sexually transmitted infection, use a condom.  Seat belts- Seat belts can save your life; always wear one.  Smoke/Carbon Monoxide detectors- These detectors need to be installed on the appropriate level of your home.  Replace batteries at least once a year.  Skin cancer- When out in the sun, cover up and use sunscreen 15 SPF or higher.  Violence- If anyone is threatening you, please tell your healthcare provider.  Living Will/ Health care power of attorney- Speak with your healthcare provider and family. Prediabetes Eating Plan Prediabetes--also called impaired glucose tolerance or impaired fasting glucose--is a condition that causes blood sugar (blood glucose) levels to be higher than normal. Following a healthy diet can help to keep prediabetes under control. It can also help to lower the risk of type 2 diabetes and heart disease, which are increased in people who have prediabetes. Along with regular exercise, a healthy diet:  Promotes weight loss.  Helps to control blood sugar levels.  Helps to improve the way that the body uses insulin. WHAT DO I NEED TO KNOW ABOUT THIS EATING PLAN?  Use the glycemic index (GI) to plan your meals. The index tells you how quickly a food will raise your blood sugar. Choose low-GI foods. These foods take a longer time to raise blood sugar.  Pay close attention to the amount of carbohydrates in the food that you eat. Carbohydrates increase blood sugar levels.  Keep track of how many calories you take in. Eating the right amount of calories will help you to achieve a healthy weight. Losing about 7 percent of your starting weight can help to prevent type 2 diabetes.  You may want to follow a Mediterranean diet. This diet includes a lot of vegetables, lean meats or fish,  whole grains, fruits, and healthy oils and fats. WHAT FOODS CAN I EAT? Grains Whole grains, such as whole-wheat or whole-grain breads, crackers, cereals, and pasta. Unsweetened oatmeal. Bulgur. Barley. Quinoa. Brown rice. Corn or whole-wheat flour tortillas or taco shells. Vegetables Lettuce. Spinach. Peas. Beets. Cauliflower. Cabbage. Broccoli. Carrots. Tomatoes. Squash. Eggplant. Herbs. Peppers. Onions. Cucumbers. Brussels sprouts. Fruits Berries. Bananas. Apples. Oranges. Grapes. Papaya. Mango. Pomegranate. Kiwi. Grapefruit. Cherries. Meats and Other Protein Sources Seafood. Lean meats, such as chicken and [place] or lean cuts of pork and beef. Tofu. Eggs. Nuts. Beans. Dairy Low-fat or fat-free dairy products, such as yogurt, cottage cheese, and cheese. Beverages Water. Tea. Coffee. Sugar-free or diet soda. Seltzer water. Milk. Milk alternatives, such as soy or almond milk. Condiments Mustard. Relish. Low-fat, low-sugar ketchup. Low-fat, low-sugar barbecue sauce. Low-fat or fat-free mayonnaise. Sweets and Desserts Sugar-free or low-fat pudding. Sugar-free or low-fat ice cream and other frozen treats. Fats and Oils Avocado. Walnuts. Olive oil. The items listed above may not be a complete list of recommended foods or beverages. Contact your dietitian for more options.  WHAT FOODS ARE NOT RECOMMENDED? Grains Refined white flour and flour  products, such as bread, pasta, snack foods, and cereals. Beverages Sweetened drinks, such as sweet iced tea and soda. Sweets and Desserts Baked goods, such as cake, cupcakes, pastries, cookies, and cheesecake. The items listed above may not be a complete list of foods and beverages to avoid. Contact your dietitian for more information.   This information is not intended to replace advice given to you by your health care provider. Make sure you discuss any questions you have with your health care provider.   Document Released: 06/03/2014 Document  Reviewed: 06/03/2014 Elsevier Interactive Patient Education [employer].

## 2015-09-02 LAB — PSA: PSA: 1.67 ng/mL (ref <=4.00)

## 2015-09-02 LAB — HEPATITIS C ANTIBODY: HCV AB: NEGATIVE

## 2015-10-01 ENCOUNTER — Encounter: Payer: Self-pay | Admitting: Physician Assistant

## 2015-10-01 DIAGNOSIS — D485 Neoplasm of uncertain behavior of skin: Secondary | ICD-10-CM | POA: Diagnosis not present

## 2015-11-08 ENCOUNTER — Emergency Department (HOSPITAL_COMMUNITY): Payer: BLUE CROSS/BLUE SHIELD

## 2015-11-08 ENCOUNTER — Emergency Department (HOSPITAL_COMMUNITY)
Admission: EM | Admit: 2015-11-08 | Discharge: 2015-11-08 | Disposition: A | Discharge status: Home / Self Care | Payer: BLUE CROSS/BLUE SHIELD | Attending: Emergency Medicine | Admitting: Emergency Medicine

## 2015-11-08 ENCOUNTER — Encounter (HOSPITAL_COMMUNITY): Payer: Self-pay | Admitting: *Deleted

## 2015-11-08 DIAGNOSIS — Z7982 Long term (current) use of aspirin: Secondary | ICD-10-CM | POA: Diagnosis not present

## 2015-11-08 DIAGNOSIS — Y999 Unspecified external cause status: Secondary | ICD-10-CM | POA: Insufficient documentation

## 2015-11-08 DIAGNOSIS — Y939 Activity, unspecified: Secondary | ICD-10-CM | POA: Diagnosis not present

## 2015-11-08 DIAGNOSIS — S0990XA Unspecified injury of head, initial encounter: Secondary | ICD-10-CM

## 2015-11-08 DIAGNOSIS — Y929 Unspecified place or not applicable: Secondary | ICD-10-CM | POA: Diagnosis not present

## 2015-11-08 DIAGNOSIS — S0993XA Unspecified injury of face, initial encounter: Secondary | ICD-10-CM | POA: Diagnosis present

## 2015-11-08 DIAGNOSIS — S01111A Laceration without foreign body of right eyelid and periocular area, initial encounter: Secondary | ICD-10-CM | POA: Diagnosis not present

## 2015-11-08 DIAGNOSIS — W0110XA Fall on same level from slipping, tripping and stumbling with subsequent striking against unspecified object, initial encounter: Secondary | ICD-10-CM | POA: Insufficient documentation

## 2015-11-08 DIAGNOSIS — R51 Headache: Secondary | ICD-10-CM | POA: Diagnosis not present

## 2015-11-08 DIAGNOSIS — W19XXXA Unspecified fall, initial encounter: Secondary | ICD-10-CM

## 2015-11-08 MED ORDER — LIDOCAINE HCL (PF) 1 % IJ SOLN
5.0000 mL | Freq: Once | INTRAMUSCULAR | Status: AC
  Administered 2015-11-08: 5 mL
  Filled 2015-11-08: qty 5

## 2015-11-08 NOTE — ED Notes (Signed)
Pt reports he washed his wound to head with peroxide. Ice pack provided to patient.

## 2015-11-08 NOTE — ED Provider Notes (Signed)
[facility] DEPT Provider Note   CSN: [id number] Arrival date & time: 11/08/15  [id number]     History   Chief Complaint Chief Complaint  Patient presents with  . Fall    HPI Maurice Hart is a 60 y.o. male.  HPI here for evaluation of head injury. Patient reports apparently 5:45 AM this morning, he tripped over a laundry basket and hit his head on the corner of a wall sustaining an injury to his right eyebrow. He reports initially feeling woozy and nauseous, mild headache, but denies any LOC, vomiting, vision changes, numbness or weakness, neck pain. Reports he takes a daily 81 mg aspirin, but no other anticoagulation. Nothing makes the problem worse. Has been applying ice with some relief.  Past Medical History:  Diagnosis Date  . Allergy     Patient Active Problem List   Diagnosis Date Noted  . Flank pain 09/17/2014  . Nephrolithiasis 09/17/2014    Past Surgical History:  Procedure Laterality Date  . COLONOSCOPY     tics  . VASECTOMY  [id number]       Home Medications    Prior to Admission medications   Medication Sig Start Date End Date Taking? Authorizing Provider  aspirin 81 MG tablet Take 81 mg by mouth daily.   Yes Historical Provider, MD  Multiple Vitamin (MULTIVITAMIN) tablet Take 1 tablet by mouth daily.   Yes Historical Provider, MD  azelastine (OPTIVAR) 0.05 % ophthalmic solution Place 1 drop into both eyes 2 (two) times daily. Patient not taking: Reported on 11/08/2015 05/01/15   Wendie Agreste, MD    Family History Family History  Problem Relation Age of Onset  . Cancer Brother   . Colon cancer Brother   . Cancer Mother   . Diabetes Mother   . Hypertension Mother   . Diabetes Father   . Cancer Father   . Heart disease Father   . Stroke Father   . Diabetes Sister   . Stroke Maternal Grandfather   . Stroke Paternal Grandfather   . Cancer Maternal Grandmother     Social History Social History  Substance Use Topics  . Smoking status: Never  Smoker  . Smokeless tobacco: Never Used  . Alcohol use 1.2 oz/week    1 Glasses of wine, 1 Cans of beer per week     Comment: 2-4 drinks     Allergies   Erythromycin   Review of Systems Review of Systems A 10 point review of systems was completed and was negative except for pertinent positives and negatives as mentioned in the history of present illness    Physical Exam Updated Vital Signs BP 134/87   Pulse 76   Temp 98.2 F (36.8 C) (Oral)   Resp 18   Ht 6' (1.829 m)   Wt 108.7 kg   SpO2 99%   BMI 32.49 kg/m   Physical Exam  Constitutional: He is oriented to person, place, and time. He appears well-developed. No distress.  Awake, alert and nontoxic in appearance  HENT:  Head: Normocephalic and atraumatic.  Right Ear: External ear normal.  Left Ear: External ear normal.  Mouth/Throat: Oropharynx is clear and moist.  Linear, longitudinal, 1.5 cm laceration over lateral one third right eyebrow. Into dermis.   Eyes: Conjunctivae and EOM are normal. Pupils are equal, round, and reactive to light.  No periorbital tenderness, swelling. No battle sign, raccoon eyes, hemotympanum. Extraocular movements intact without nystagmus or discomfort.  Neck: Normal range of motion. No  JVD present.  No neck pain, full active range of motion.  Cardiovascular: Normal rate, regular rhythm and normal heart sounds.   Pulmonary/Chest: Effort normal and breath sounds normal. No stridor.  Abdominal: Soft. There is no tenderness.  Musculoskeletal: Normal range of motion.  Neurological: He is alert and oriented to person, place, and time. No cranial nerve deficit. Coordination normal.  Awake, alert, cooperative and aware of situation; motor strength bilaterally; sensation normal to light touch bilaterally; no facial asymmetry; tongue midline; major cranial nerves appear intact;  baseline gait without new ataxia. Finger to nose normal, no pronator drift.  Skin: No rash noted. He is not  diaphoretic.  Psychiatric: He has a normal mood and affect. His behavior is normal. Thought content normal.  Nursing note and vitals reviewed.    ED Treatments / Results  Labs (all labs ordered are listed, but only abnormal results are displayed) Labs Reviewed - No data to display  EKG  EKG Interpretation None       Radiology Ct Head Wo Contrast  Result Date: 11/08/2015 CLINICAL DATA:  Pain following fall EXAM: CT HEAD WITHOUT CONTRAST TECHNIQUE: Contiguous axial images were obtained from the base of the skull through the vertex without intravenous contrast. COMPARISON:  None. FINDINGS: Brain: The ventricles are normal in size and configuration. There is no intracranial mass, hemorrhage, extra-axial fluid collection, or midline shift. There is slight small vessel disease in the centra semiovale bilaterally. Elsewhere gray-white compartments appear normal. No acute infarct evident. Vascular: There is no hyperdense vessel. There is no appreciable vascular calcification. Skull: The bony calvarium appears intact. There is laceration anterior to the supraorbital region on the right with soft tissue air but no bony abnormality. No radiopaque foreign body in this area. Sinuses/Orbits: There is opacification of several anterior ethmoid air cells on the right extending into the inferior most aspect of the right frontal sinus. There is slight mucosal thickening in the medial maxillary antra bilaterally. Other visualized paranasal sinuses are clear. Orbits appear symmetric bilaterally. Other: Mastoid air cells are clear. IMPRESSION: Slight periventricular small vessel disease. No intracranial mass, hemorrhage, or extra-axial fluid collection. No acute appearing infarct. Soft tissue air is noted anterior to the supraorbital region on the right without bony abnormality. No fracture evident. Areas of paranasal sinus disease, primarily in the anterior right ethmoid air cell complex. Electronically Signed   By:  Lowella Grip III M.D.   On: 11/08/2015 09:39    Procedures Procedures (including critical care time)  LACERATION REPAIR Performed by: Verl Dicker Authorized by: Verl Dicker Consent: Verbal consent obtained. Risks and benefits: risks, benefits and alternatives were discussed Consent given by: patient Patient identity confirmed: provided demographic data Prepped and Draped in normal sterile fashion Wound explored  Laceration Location: Right eyebrow  Laceration Length: 1.5cm  No Foreign Bodies seen or palpated  Anesthesia: local infiltration  Local anesthetic: lidocaine 1 % with epinephrine  Anesthetic total: 2 ml  Irrigation method: syringe Amount of cleaning: standard  Skin closure: 4-0 Prolene   Number of sutures: 1, running  Technique: running  Patient tolerance: Patient tolerated the procedure well with no immediate complications.   Medications Ordered in ED Medications  lidocaine (PF) (XYLOCAINE) 1 % injection 5 mL (5 mLs Infiltration Given 11/08/15 [id number])     Initial Impression / Assessment and Plan / ED Course  I have reviewed the triage vital signs and the nursing notes.  Pertinent labs & imaging results that were available during my care  of the patient were reviewed by me and considered in my medical decision making (see chart for details).  Clinical Course    Wife at bedside is very worried about patient, discussed exam findings and presentation are reassuring. However, due to patient's concern/age, we'll obtain CT head to rule out emergent intracranial bleed or fracture. CT head is negative for any acute or emergent pathology. He overall appears very well, Nontoxic and appropriate for outpatient follow-up. Discussed return precautions.  Final Clinical Impressions(s) / ED Diagnoses   Final diagnoses:  Fall, initial encounter  Injury of head, initial encounter  Laceration of right eyebrow, initial encounter    New  Prescriptions Discharge Medication List as of 11/08/2015 10:39 AM       Comer Locket, PA-C 11/08/15 1357    Virgel Manifold, MD 11/10/15 [phone]

## 2015-11-08 NOTE — ED Triage Notes (Signed)
The pt fell this am after he tripped over a basket of laundry striking the sharp corner of the door  Small laceration rt eyebrow  No loc

## 2015-11-08 NOTE — Discharge Instructions (Signed)
Keep your wound clean and dry. He may use topical antibiotic as we discussed. Follow-up with your doctor in 5 days for suture removal. Return to ED for any new or worsening symptoms as we discussed.

## 2015-11-13 ENCOUNTER — Ambulatory Visit (INDEPENDENT_AMBULATORY_CARE_PROVIDER_SITE_OTHER): Payer: BLUE CROSS/BLUE SHIELD | Admitting: Physician Assistant

## 2015-11-13 VITALS — BP 122/80 | HR 65 | Temp 98.3°F | Resp 16 | Ht 72.0 in | Wt 240.0 lb

## 2015-11-13 DIAGNOSIS — Z4802 Encounter for removal of sutures: Secondary | ICD-10-CM

## 2015-11-13 NOTE — Patient Instructions (Signed)
     IF you received an x-ray today, you will receive an invoice from [facility]. Please contact [facility] at [phone] with questions or concerns regarding your invoice.   IF you received labwork today, you will receive an invoice from [facility]. Please contact [facility] at [phone] with questions or concerns regarding your invoice.   Our billing staff will not be able to assist you with questions regarding bills from these companies.  You will be contacted with the lab results as soon as they are available. The fastest way to get your results is to activate your My Chart account. Instructions are located on the last page of this paperwork. If you have not heard from us regarding the results in 2 weeks, please contact this office.      

## 2015-11-13 NOTE — Progress Notes (Signed)
   Maurice Hart  MRN: [id number] DOB: Oct 10, 1955  PCP: No primary care provider on file.  Subjective:  Pt is a 60 year old male who presents to clinic for suture removal. Five days ago, He was walking through his home in the dark and tripped over a laundry basket. He hit his right eyebrow on the corner of a cabinet and split it open. He received sutures from the emergency department and is here today to have them removed. Sutures were in five days, he reports no problems. Is feeling well.   Denies LOC, AMS, vision changes, memory loss, headache.   Review of Systems  Constitutional: Negative for chills, diaphoresis, fatigue and fever.  Respiratory: Negative for cough, chest tightness and shortness of breath.   Cardiovascular: Negative for chest pain and palpitations.  Skin: Positive for wound. Negative for color change.  Neurological: Negative for dizziness, syncope, weakness, light-headedness and headaches.  Psychiatric/Behavioral: Negative for confusion, decreased concentration and sleep disturbance.    Patient Active Problem List   Diagnosis Date Noted  . Flank pain 09/17/2014  . Nephrolithiasis 09/17/2014    Current Outpatient Prescriptions on File Prior to Visit  Medication Sig Dispense Refill  . aspirin 81 MG tablet Take 81 mg by mouth daily.    Marland Kitchen azelastine (OPTIVAR) 0.05 % ophthalmic solution Place 1 drop into both eyes 2 (two) times daily. 6 mL 6  . Multiple Vitamin (MULTIVITAMIN) tablet Take 1 tablet by mouth daily.     No current facility-administered medications on file prior to visit.     Allergies  Allergen Reactions  . Erythromycin Diarrhea    Objective:  BP 122/80 (BP Location: Right Arm, Patient Position: Sitting, Cuff Size: Normal)   Pulse 65   Temp 98.3 F (36.8 C) (Oral)   Resp 16   Ht 6' (1.829 m)   Wt 240 lb (108.9 kg)   SpO2 98%   BMI 32.55 kg/m   Physical Exam  Constitutional: He is oriented to person, place, and time and well-developed,  well-nourished, and in no distress. No distress.  Cardiovascular: Normal rate, regular rhythm and normal heart sounds.   Neurological: He is alert and oriented to person, place, and time. GCS score is 15.  Skin: Skin is warm and dry.  1 cm well-healed laceration superior to right eyebrow. Two sutures are in place. No drainage, erythema or swelling noted. Not TTP. Sutures removed without difficulty.   Psychiatric: Mood, memory, affect and judgment normal.  Vitals reviewed.   Assessment and Plan :  1. Visit for suture removal - Wound care discussed with patient. RTC for worsening symptoms.   Mercer Pod, PA-C  [facility] 11/13/2015 8:48 AM

## 2016-06-29 DIAGNOSIS — H903 Sensorineural hearing loss, bilateral: Secondary | ICD-10-CM | POA: Diagnosis not present

## 2016-06-29 DIAGNOSIS — H9312 Tinnitus, left ear: Secondary | ICD-10-CM | POA: Diagnosis not present

## 2016-07-11 ENCOUNTER — Encounter (HOSPITAL_COMMUNITY): Payer: Self-pay | Admitting: *Deleted

## 2016-07-11 ENCOUNTER — Emergency Department (HOSPITAL_COMMUNITY)
Admission: EM | Admit: 2016-07-11 | Discharge: 2016-07-11 | Disposition: A | Discharge status: Home / Self Care | Payer: BLUE CROSS/BLUE SHIELD | Attending: Emergency Medicine | Admitting: Emergency Medicine

## 2016-07-11 ENCOUNTER — Emergency Department (HOSPITAL_COMMUNITY): Payer: BLUE CROSS/BLUE SHIELD

## 2016-07-11 DIAGNOSIS — Z79899 Other long term (current) drug therapy: Secondary | ICD-10-CM | POA: Diagnosis not present

## 2016-07-11 DIAGNOSIS — Z7982 Long term (current) use of aspirin: Secondary | ICD-10-CM | POA: Insufficient documentation

## 2016-07-11 DIAGNOSIS — R112 Nausea with vomiting, unspecified: Secondary | ICD-10-CM | POA: Insufficient documentation

## 2016-07-11 DIAGNOSIS — R42 Dizziness and giddiness: Secondary | ICD-10-CM | POA: Diagnosis not present

## 2016-07-11 DIAGNOSIS — R9431 Abnormal electrocardiogram [ECG] [EKG]: Secondary | ICD-10-CM | POA: Diagnosis not present

## 2016-07-11 LAB — BASIC METABOLIC PANEL
Anion gap: 11 (ref 5–15)
BUN: 23 mg/dL — AB (ref 6–20)
CALCIUM: 9.2 mg/dL (ref 8.9–10.3)
CO2: 21 mmol/L — ABNORMAL LOW (ref 22–32)
Chloride: 107 mmol/L (ref 101–111)
Creatinine, Ser: 1.32 mg/dL — ABNORMAL HIGH (ref 0.61–1.24)
GFR calc Af Amer: >60 mL/min (ref >60)
GFR, EST NON AFRICAN AMERICAN: 57 mL/min — AB (ref >60)
GLUCOSE: 190 mg/dL — AB (ref 65–99)
Potassium: 3.7 mmol/L (ref 3.5–5.1)
SODIUM: 139 mmol/L (ref 135–145)

## 2016-07-11 LAB — URINALYSIS, ROUTINE W REFLEX MICROSCOPIC
BILIRUBIN URINE: NEGATIVE
Glucose, UA: NEGATIVE mg/dL
Hgb urine dipstick: NEGATIVE
Ketones, ur: 5 mg/dL — AB
Leukocytes, UA: NEGATIVE
NITRITE: NEGATIVE
PH: 5 (ref 5.0–8.0)
PROTEIN: NEGATIVE mg/dL
Specific Gravity, Urine: 1.015 (ref 1.005–1.030)

## 2016-07-11 LAB — TROPONIN I: Troponin I: <0.03 ng/mL (ref <0.03)

## 2016-07-11 LAB — CBC WITH DIFFERENTIAL/PLATELET
BASOS ABS: 0 10*3/uL (ref 0.0–0.1)
BASOS PCT: 0 %
Eosinophils Absolute: 0.1 10*3/uL (ref 0.0–0.7)
Eosinophils Relative: 1 %
HCT: 45.3 % (ref 39.0–52.0)
Hemoglobin: 15.4 g/dL (ref 13.0–17.0)
LYMPHS PCT: 27 %
Lymphs Abs: 2.7 10*3/uL (ref 0.7–4.0)
MCH: 29.2 pg (ref 26.0–34.0)
MCHC: 34 g/dL (ref 30.0–36.0)
MCV: 86 fL (ref 78.0–100.0)
MONO ABS: 0.7 10*3/uL (ref 0.1–1.0)
Monocytes Relative: 7 %
Neutro Abs: 6.3 10*3/uL (ref 1.7–7.7)
Neutrophils Relative %: 65 %
PLATELETS: 186 10*3/uL (ref 150–400)
RBC: 5.27 MIL/uL (ref 4.22–5.81)
RDW: 12.5 % (ref 11.5–15.5)
WBC: 9.8 10*3/uL (ref 4.0–10.5)

## 2016-07-11 MED ORDER — SODIUM CHLORIDE 0.9 % IV BOLUS (SEPSIS)
1000.0000 mL | Freq: Once | INTRAVENOUS | Status: AC
  Administered 2016-07-11: 1000 mL via INTRAVENOUS

## 2016-07-11 MED ORDER — DIPHENHYDRAMINE HCL 50 MG/ML IJ SOLN
12.5000 mg | Freq: Once | INTRAMUSCULAR | Status: AC
  Administered 2016-07-11: 12.5 mg via INTRAVENOUS
  Filled 2016-07-11: qty 1

## 2016-07-11 MED ORDER — MECLIZINE HCL 25 MG PO TABS
25.0000 mg | ORAL_TABLET | Freq: Once | ORAL | Status: AC
  Administered 2016-07-11: 25 mg via ORAL
  Filled 2016-07-11: qty 1

## 2016-07-11 MED ORDER — ONDANSETRON HCL 4 MG/2ML IJ SOLN
4.0000 mg | Freq: Once | INTRAMUSCULAR | Status: AC
  Administered 2016-07-11: 4 mg via INTRAVENOUS
  Filled 2016-07-11: qty 2

## 2016-07-11 MED ORDER — MECLIZINE HCL 25 MG PO TABS: 25.0000 mg | ORAL_TABLET | Freq: Three times a day (TID) | ORAL | 0 refills | Status: DC | PRN

## 2016-07-11 MED ORDER — ONDANSETRON 4 MG PO TBDP
4.0000 mg | ORAL_TABLET | Freq: Once | ORAL | Status: AC
  Administered 2016-07-11: 4 mg via ORAL
  Filled 2016-07-11: qty 1

## 2016-07-11 MED ORDER — ONDANSETRON 4 MG PO TBDP: 4.0000 mg | ORAL_TABLET | Freq: Three times a day (TID) | ORAL | 0 refills | Status: DC | PRN

## 2016-07-11 MED ORDER — LORAZEPAM 2 MG/ML IJ SOLN
1.0000 mg | Freq: Once | INTRAMUSCULAR | Status: AC
  Administered 2016-07-11: 1 mg via INTRAVENOUS
  Filled 2016-07-11: qty 1

## 2016-07-11 MED ORDER — PROMETHAZINE HCL 25 MG/ML IJ SOLN
12.5000 mg | Freq: Once | INTRAMUSCULAR | Status: AC
  Administered 2016-07-11: 12.5 mg via INTRAVENOUS
  Filled 2016-07-11: qty 1

## 2016-07-11 NOTE — ED Notes (Signed)
EDP notified of bp 96/54. Pt oxygen sat dropping to mid-80's; pt placed on 2lpm [place]. edp notified.

## 2016-07-11 NOTE — ED Provider Notes (Signed)
[facility] DEPT Provider Note   CSN: [id number] Arrival date & time: 07/11/16  1424     History   Chief Complaint Chief Complaint  Patient presents with  . Emesis    HPI BROLY HATFIELD is a 61 y.o. male.  Pt presents to the ED today with n/v and dizziness.  The pt was sitting with his wife at [facility] radiology.  She was the patient.  He was fine, then suddenly felt dizzy and lightheaded.  He started vomiting.  EMS was called to bring him here.  The pt was given oral zofran which he immediately threw up.  Pt denies any pain.      Past Medical History:  Diagnosis Date  . Allergy     Patient Active Problem List   Diagnosis Date Noted  . Flank pain 09/17/2014  . Nephrolithiasis 09/17/2014    Past Surgical History:  Procedure Laterality Date  . COLONOSCOPY     tics  . VASECTOMY  [id number]       Home Medications    Prior to Admission medications   Medication Sig Start Date End Date Taking? Authorizing Provider  aspirin 81 MG tablet Take 81 mg by mouth daily.   Yes [provider]  Multiple Vitamin (MULTIVITAMIN) tablet Take 1 tablet by mouth daily.   Yes [provider]  naproxen sodium (ANAPROX) 220 MG tablet Take 220 mg by mouth daily as needed (pain).   Yes [provider]  azelastine (OPTIVAR) 0.05 % ophthalmic solution Place 1 drop into both eyes 2 (two) times daily. Patient not taking: Reported on 07/11/2016 05/01/15   Wendie Agreste, MD    Family History Family History  Problem Relation Age of Onset  . Cancer Brother   . Colon cancer Brother   . Cancer Mother   . Diabetes Mother   . Hypertension Mother   . Diabetes Father   . Cancer Father   . Heart disease Father   . Stroke Father   . Diabetes Sister   . Stroke Maternal Grandfather   . Stroke Paternal Grandfather   . Cancer Maternal Grandmother     Social History Social History  Substance Use Topics  . Smoking status: Never Smoker  . Smokeless tobacco:  Never Used  . Alcohol use 1.2 oz/week    1 Glasses of wine, 1 Cans of beer per week     Comment: 2-4 drinks     Allergies   Erythromycin   Review of Systems Review of Systems  Gastrointestinal: Positive for nausea and vomiting.  Neurological: Positive for dizziness.  All other systems reviewed and are negative.    Physical Exam Updated Vital Signs BP (!) 96/54   Pulse 70   Resp 20   SpO2 99%   Physical Exam  Constitutional: He is oriented to person, place, and time. He appears distressed.  HENT:  Head: Normocephalic.  Right Ear: External ear normal.  Left Ear: External ear normal.  Nose: Nose normal.  Mouth/Throat: Oropharynx is clear and moist.  Eyes: Conjunctivae and EOM are normal. Pupils are equal, round, and reactive to light.  Neck: Normal range of motion. Neck supple.  Cardiovascular: Normal rate, regular rhythm, normal heart sounds and intact distal pulses.   Pulmonary/Chest: Effort normal and breath sounds normal.  Abdominal: Soft. Bowel sounds are normal.  Musculoskeletal: Normal range of motion.  Neurological: He is alert and oriented to person, place, and time.  Skin: Skin is warm. He is diaphoretic.  Psychiatric: He  has a normal mood and affect. His behavior is normal. Judgment and thought content normal.  Nursing note and vitals reviewed.    ED Treatments / Results  Labs (all labs ordered are listed, but only abnormal results are displayed) Labs Reviewed  BASIC METABOLIC PANEL - Abnormal; Notable for the following:       Result Value   CO2 21 (*)    Glucose, Bld 190 (*)    BUN 23 (*)    Creatinine, Ser 1.32 (*)    GFR calc non Af Amer 57 (*)    All other components within normal limits  CBC WITH DIFFERENTIAL/PLATELET  TROPONIN I  URINALYSIS, ROUTINE W REFLEX MICROSCOPIC    EKG  EKG Interpretation  Date/Time:  Monday July 11 2016 15:10:19 EDT Ventricular Rate:  66 PR Interval:    QRS Duration: 94 QT Interval:  446 QTC  Calculation: 468 R Axis:   24 Text Interpretation:  Sinus rhythm Low voltage, extremity and precordial leads Confirmed by Isla Pence [phone]) on 07/11/2016 3:19:13 PM       Radiology No results found.  Procedures Procedures (including critical care time)  Medications Ordered in ED Medications  promethazine (PHENERGAN) injection 12.5 mg (not administered)  sodium chloride 0.9 % bolus 1,000 mL (1,000 mLs Intravenous New Bag/Given 07/11/16 1456)  ondansetron (ZOFRAN) injection 4 mg (4 mg Intravenous Given 07/11/16 1454)  LORazepam (ATIVAN) injection 1 mg (1 mg Intravenous Given 07/11/16 1454)  sodium chloride 0.9 % bolus 1,000 mL (1,000 mLs Intravenous New Bag/Given 07/11/16 1533)     Initial Impression / Assessment and Plan / ED Course  I have reviewed the triage vital signs and the nursing notes.  Pertinent labs & imaging results that were available during my care of the patient were reviewed by me and considered in my medical decision making (see chart for details).    Pt had improved significantly and was feeling better.  He looked much better.   He then started vomiting again.  Phenergan ordered.  CT head and UA still pending.  Pt will be signed out to Dr. Jeneen Rinks for reevaluation and results of those tests.  Final Clinical Impressions(s) / ED Diagnoses   Final diagnoses:  Vertigo  Non-intractable vomiting with nausea, unspecified vomiting type    New Prescriptions New Prescriptions   No medications on file     Isla Pence, MD 07/11/16 1629

## 2016-07-11 NOTE — ED Notes (Signed)
Pt taken to CT.

## 2016-07-11 NOTE — ED Triage Notes (Signed)
Pt arrived via EMS after vomiting while awaiting on wife at MD appt. Pt became sweaty and began vomiting. Pt denies chest pain. Pt is alert and oriented x4. Pt ate salad dressing dated 11/2015 prior to n/v episode.

## 2016-07-11 NOTE — Discharge Instructions (Signed)
Take meclizine until 24 hours without vertigo. Follow-up with your ENT doctor if not improving.

## 2016-07-18 ENCOUNTER — Ambulatory Visit (INDEPENDENT_AMBULATORY_CARE_PROVIDER_SITE_OTHER): Payer: BLUE CROSS/BLUE SHIELD | Admitting: Family Medicine

## 2016-07-18 ENCOUNTER — Encounter: Payer: Self-pay | Admitting: Family Medicine

## 2016-07-18 VITALS — BP 130/80 | HR 76 | Temp 98.2°F | Resp 18 | Ht 72.0 in | Wt 236.6 lb

## 2016-07-18 DIAGNOSIS — E785 Hyperlipidemia, unspecified: Secondary | ICD-10-CM

## 2016-07-18 DIAGNOSIS — Z125 Encounter for screening for malignant neoplasm of prostate: Secondary | ICD-10-CM

## 2016-07-18 DIAGNOSIS — Z0000 Encounter for general adult medical examination without abnormal findings: Secondary | ICD-10-CM | POA: Diagnosis not present

## 2016-07-18 DIAGNOSIS — R7303 Prediabetes: Secondary | ICD-10-CM

## 2016-07-18 NOTE — Progress Notes (Signed)
Subjective:  By signing my name below, I, Essence Howell, attest that this documentation has been prepared under the direction and in the presence of Wendie Agreste, MD Electronically Signed: Ladene Artist, ED Scribe 07/18/2016 at 3:32 PM.   Patient ID: Maurice Hart, male    DOB: 1955/09/14, 61 y.o.   MRN: [id number]  Chief Complaint  Patient presents with  . Annual Exam   HPI Maurice Hart is a 61 y.o. male who presents to Primary Care at [facility] for an annual exam.   CA Screening Colonoscopy: 09/2014 by Dr. Deatra Ina showed moderate diverticulosis. Repeat in 5 years. Pt's brother passed at age 75 from colon CA. Prostate CA Lab Results  Component Value Date   PSA 1.67 09/01/2015   PSA 1.49 07/12/2014   PSA 1.50 01/19/2012   Immunizations  Immunization History  Administered Date(s) Administered  . Influenza Split 12/20/2011  . Td 02/01/2004  . Tdap 09/01/2015   Depression Screening  Depression screen [facility] 2/9 07/18/2016 11/13/2015 09/01/2015 05/01/2015 09/17/2014  Decreased Interest 0 0 0 0 0  Down, Depressed, Hopeless 0 0 0 0 0  PHQ - 2 Score 0 0 0 0 0     Visual Acuity Screening   Right eye Left eye Both eyes  Without correction:     With correction: 20/20 20/20 20/20   Vision: Last eye exam in August 2017.  Dentist: Followed regularly by Dr. Mirna Mires.  Hearing: Pt had a free hearing test done at a conference. Followed up with Dr. Thornell Mule in May for tinnitus that he describes as a high-pitch whistle and was told that his left ear is "slightly off on higher frequencies". Follow-up in a few years.  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   Wt Readings from Last 3 Encounters:  07/18/16 236 lb 9.6 oz (107.3 kg)  11/13/15 240 lb (108.9 kg)  11/08/15 239 lb 9 oz (108.7 kg)   Lipid Screening Mildly elevated in August. Hyperglycemia/Pre-DM. Lab Results  Component Value Date   CHOL 181 09/01/2015   HDL 33 (L) 09/01/2015   LDLCALC 115 09/01/2015   TRIG 167 (H) 09/01/2015   CHOLHDL 5.5 (H) 09/01/2015   Lab Results  Component Value Date   HGBA1C 6.4 (H) 09/01/2015   Patient Active Problem List   Diagnosis Date Noted  . Flank pain 09/17/2014  . Nephrolithiasis 09/17/2014   Past Medical History:  Diagnosis Date  . Allergy    Past Surgical History:  Procedure Laterality Date  . COLONOSCOPY     tics  . VASECTOMY  [id number]   Allergies  Allergen Reactions  . Erythromycin Diarrhea   Prior to Admission medications   Medication Sig Start Date End Date Taking? Authorizing Provider  aspirin 81 MG tablet Take 81 mg by mouth daily.   Yes [provider]  Multiple Vitamin (MULTIVITAMIN) tablet Take 1 tablet by mouth daily.   Yes [provider]  azelastine (OPTIVAR) 0.05 % ophthalmic solution Place 1 drop into both eyes 2 (two) times daily. Patient not taking: Reported on 07/11/2016 05/01/15   Wendie Agreste, MD  meclizine (ANTIVERT) 25 MG tablet Take 1 tablet (25 mg total) by mouth 3 (three) times daily as needed. Take 3 times per day until 24 hours without vertigo. Patient not taking: Reported on 07/18/2016 07/11/16   Tanna Furry, MD   Social History   Social History  . Marital status: Married  Spouse name: N/A  . Number of children: N/A  . Years of education: N/A   Occupational History  . professor [employer]   Social History Main Topics  . Smoking status: Never Smoker  . Smokeless tobacco: Never Used  . Alcohol use 1.2 oz/week    1 Glasses of wine, 1 Cans of beer per week     Comment: 2-4 drinks  . Drug use: No  . Sexual activity: No   Other Topics Concern  . Not on file   Social History Narrative   Married. Exercise: Yes. Education: College/Other.   Review of Systems  HENT: Positive for tinnitus (L ear).       Objective:   Physical Exam  Constitutional: He is oriented to person, place, and time. He appears well-developed and well-nourished.  HENT:    Head: Normocephalic and atraumatic.  Right Ear: External ear normal.  Left Ear: External ear normal.  Mouth/Throat: Oropharynx is clear and moist.  Eyes: Conjunctivae and EOM are normal. Pupils are equal, round, and reactive to light.  Neck: Normal range of motion. Neck supple. No thyromegaly present.  Cardiovascular: Normal rate, regular rhythm, normal heart sounds and intact distal pulses.   Pulmonary/Chest: Effort normal and breath sounds normal. No respiratory distress. He has no wheezes.  Abdominal: Soft. He exhibits no distension. There is no tenderness. Hernia confirmed negative in the right inguinal area and confirmed negative in the left inguinal area.  Genitourinary: Prostate normal.  Musculoskeletal: Normal range of motion. He exhibits no edema or tenderness.  Lymphadenopathy:    He has no cervical adenopathy.  Neurological: He is alert and oriented to person, place, and time. He has normal reflexes.  Skin: Skin is warm and dry.  Psychiatric: He has a normal mood and affect. His behavior is normal.  Vitals reviewed.     Vitals:   07/18/16 1422  BP: 130/80  Pulse: 76  Resp: 18  Temp: 98.2 F (36.8 C)  TempSrc: Oral  SpO2: 96%  Weight: 236 lb 9.6 oz (107.3 kg)  Height: 6' (1.829 m)      Assessment & Plan:  Maurice Hart is a 61 y.o. male Annual physical exam  - -anticipatory guidance as below in AVS, screening labs above. Health maintenance items as above in HPI discussed/recommended as applicable.   Prediabetes - Plan: Hemoglobin A1c  - Check A1c, diet and exercise for weight management.  Screening for prostate cancer - Plan: PSA  - We discussed pros and cons of prostate cancer screening, and after this discussion, he chose to have screening done. PSA obtained, and no concerning findings on DRE.   Hyperlipidemia, unspecified hyperlipidemia type - Plan: Comprehensive metabolic panel, Lipid panel  - Check lipids, CMP. Diet/exercise as above.  No orders of the  defined types were placed in this encounter.  Patient Instructions    I will check your 3 month blood sugar test again, and other labs as we discussed. Enjoy your summer, follow-up in 6 months for repeat blood sugar testing and cholesterol testing.  Keeping you healthy  Get these tests  Blood pressure- Have your blood pressure checked once a year by your healthcare provider.  Normal blood pressure is 120/80  Weight- Have your body mass index (BMI) calculated to screen for obesity.  BMI is a measure of body fat based on height and weight. You can also calculate your own BMI at [email or web address].  Cholesterol- Have your cholesterol checked every year.  Diabetes- Have your blood sugar  checked regularly if you have high blood pressure, high cholesterol, have a family history of diabetes or if you are overweight.  Screening for Colon Cancer- Colonoscopy starting at age 78.  Screening may begin sooner depending on your family history and other health conditions. Follow up colonoscopy as directed by your Gastroenterologist.  Screening for Prostate Cancer- Both blood work (PSA) and a rectal exam help screen for Prostate Cancer.  Screening begins at age 64 with African-American men and at age 74 with Caucasian men.  Screening may begin sooner depending on your family history.  Take these medicines  Aspirin- One aspirin daily can help prevent Heart disease and Stroke.  Flu shot- Every fall.  Tetanus- Every 10 years.  Zostavax- Once after the age of 56 to prevent Shingles.  Pneumonia shot- Once after the age of 44; if you are younger than 71, ask your healthcare provider if you need a Pneumonia shot.  Take these steps  Don't smoke- If you do smoke, talk to your doctor about quitting.  For tips on how to quit, go to www.smokefree.gov or call 1-800-QUIT-NOW.  Be physically active- Exercise 5 days a week for at least 30 minutes.  If you are not already physically active start slow and  gradually work up to 30 minutes of moderate physical activity.  Examples of moderate activity include walking briskly, mowing the yard, dancing, swimming, bicycling, etc.  Eat a healthy diet- Eat a variety of healthy food such as fruits, vegetables, low fat milk, low fat cheese, yogurt, lean meant, poultry, fish, beans, tofu, etc. For more information go to www.thenutritionsource.org  Drink alcohol in moderation- Limit alcohol intake to less than two drinks a day. Never drink and drive.  Dentist- Brush and floss twice daily; visit your dentist twice a year.  Depression- Your emotional health is as important as your physical health. If you're feeling down, or losing interest in things you would normally enjoy please talk to your healthcare provider.  Eye exam- Visit your eye doctor every year.  Safe sex- If you may be exposed to a sexually transmitted infection, use a condom.  Seat belts- Seat belts can save your life; always wear one.  Smoke/Carbon Monoxide detectors- These detectors need to be installed on the appropriate level of your home.  Replace batteries at least once a year.  Skin cancer- When out in the sun, cover up and use sunscreen 15 SPF or higher.  Violence- If anyone is threatening you, please tell your healthcare provider.  Living Will/ Health care power of attorney- Speak with your healthcare provider and family.   IF you received an x-ray today, you will receive an invoice from [facility]. Please contact [facility] at [phone] with questions or concerns regarding your invoice.   IF you received labwork today, you will receive an invoice from [place]. Please contact [facility] at [phone] with questions or concerns regarding your invoice.   Our billing staff will not be able to assist you with questions regarding bills from these companies.  You will be contacted with the lab results as soon as they are available. The fastest way to get your  results is to activate your My Chart account. Instructions are located on the last page of this paperwork. If you have not heard from [place] regarding the results in 2 weeks, please contact this office.      I personally performed the services described in this documentation, which was scribed in my presence. The recorded information has been  reviewed and considered for accuracy and completeness, addended by me as needed, and agree with information above.  Signed,   [name], MD Primary [facility] at [place].  07/20/16 2:29 PM

## 2016-07-18 NOTE — Patient Instructions (Addendum)
I will check your 3 month blood sugar test again, and other labs as we discussed. Enjoy your summer, follow-up in 6 months for repeat blood sugar testing and cholesterol testing.  Keeping you healthy  Get these tests  Blood pressure- Have your blood pressure checked once a year by your healthcare provider.  Normal blood pressure is 120/80  Weight- Have your body mass index (BMI) calculated to screen for obesity.  BMI is a measure of body fat based on height and weight. You can also calculate your own BMI at [email or web address].  Cholesterol- Have your cholesterol checked every year.  Diabetes- Have your blood sugar checked regularly if you have high blood pressure, high cholesterol, have a family history of diabetes or if you are overweight.  Screening for Colon Cancer- Colonoscopy starting at age 92.  Screening may begin sooner depending on your family history and other health conditions. Follow up colonoscopy as directed by your Gastroenterologist.  Screening for Prostate Cancer- Both blood work (PSA) and a rectal exam help screen for Prostate Cancer.  Screening begins at age 60 with African-American men and at age 59 with Caucasian men.  Screening may begin sooner depending on your family history.  Take these medicines  Aspirin- One aspirin daily can help prevent Heart disease and Stroke.  Flu shot- Every fall.  Tetanus- Every 10 years.  Zostavax- Once after the age of 2 to prevent Shingles.  Pneumonia shot- Once after the age of 14; if you are younger than 35, ask your healthcare provider if you need a Pneumonia shot.  Take these steps  Don't smoke- If you do smoke, talk to your doctor about quitting.  For tips on how to quit, go to www.smokefree.gov or call 1-800-QUIT-NOW.  Be physically active- Exercise 5 days a week for at least 30 minutes.  If you are not already physically active start slow and gradually work up to 30 minutes of moderate physical activity.  Examples  of moderate activity include walking briskly, mowing the yard, dancing, swimming, bicycling, etc.  Eat a healthy diet- Eat a variety of healthy food such as fruits, vegetables, low fat milk, low fat cheese, yogurt, lean meant, poultry, fish, beans, tofu, etc. For more information go to www.thenutritionsource.org  Drink alcohol in moderation- Limit alcohol intake to less than two drinks a day. Never drink and drive.  Dentist- Brush and floss twice daily; visit your dentist twice a year.  Depression- Your emotional health is as important as your physical health. If you're feeling down, or losing interest in things you would normally enjoy please talk to your healthcare provider.  Eye exam- Visit your eye doctor every year.  Safe sex- If you may be exposed to a sexually transmitted infection, use a condom.  Seat belts- Seat belts can save your life; always wear one.  Smoke/Carbon Monoxide detectors- These detectors need to be installed on the appropriate level of your home.  Replace batteries at least once a year.  Skin cancer- When out in the sun, cover up and use sunscreen 15 SPF or higher.  Violence- If anyone is threatening you, please tell your healthcare provider.  Living Will/ Health care power of attorney- Speak with your healthcare provider and family.   IF you received an x-ray today, you will receive an invoice from [facility]. Please contact [facility] at [phone] with questions or concerns regarding your invoice.   IF you received labwork today, you will receive an invoice from [facility]. Please contact [facility] at  [phone] with questions or concerns regarding your invoice.   Our billing staff will not be able to assist you with questions regarding bills from these companies.  You will be contacted with the lab results as soon as they are available. The fastest way to get your results is to activate your My Chart account. Instructions are located on  the last page of this paperwork. If you have not heard from [place] regarding the results in 2 weeks, please contact this office.

## 2016-07-19 LAB — COMPREHENSIVE METABOLIC PANEL
A/G RATIO: 1.5 (ref 1.2–2.2)
ALK PHOS: 48 IU/L (ref 39–117)
ALT: 16 IU/L (ref 0–44)
AST: 18 IU/L (ref 0–40)
Albumin: 4 g/dL (ref 3.6–4.8)
BILIRUBIN TOTAL: 0.7 mg/dL (ref 0.0–1.2)
BUN/Creatinine Ratio: 15 (ref 10–24)
BUN: 20 mg/dL (ref 8–27)
CHLORIDE: 105 mmol/L (ref 96–106)
CO2: 23 mmol/L (ref 20–29)
Calcium: 9.3 mg/dL (ref 8.6–10.2)
Creatinine, Ser: 1.33 mg/dL — ABNORMAL HIGH (ref 0.76–1.27)
GFR calc non Af Amer: 57 mL/min/{1.73_m2} — ABNORMAL LOW (ref >59)
GFR, EST AFRICAN AMERICAN: 66 mL/min/{1.73_m2} (ref >59)
Globulin, Total: 2.7 g/dL (ref 1.5–4.5)
Glucose: 108 mg/dL — ABNORMAL HIGH (ref 65–99)
POTASSIUM: 4.2 mmol/L (ref 3.5–5.2)
Sodium: 142 mmol/L (ref 134–144)
Total Protein: 6.7 g/dL (ref 6.0–8.5)

## 2016-07-19 LAB — HEMOGLOBIN A1C
Est. average glucose Bld gHb Est-mCnc: 126 mg/dL
HEMOGLOBIN A1C: 6 % — AB (ref 4.8–5.6)

## 2016-07-19 LAB — LIPID PANEL
CHOLESTEROL TOTAL: 168 mg/dL (ref 100–199)
Chol/HDL Ratio: 4.5 ratio (ref 0.0–5.0)
HDL: 37 mg/dL — AB (ref >39)
LDL Calculated: 104 mg/dL — ABNORMAL HIGH (ref 0–99)
TRIGLYCERIDES: 136 mg/dL (ref 0–149)
VLDL Cholesterol Cal: 27 mg/dL (ref 5–40)

## 2016-07-19 LAB — PSA: Prostate Specific Ag, Serum: 2 ng/mL (ref 0.0–4.0)

## 2016-12-05 DIAGNOSIS — H53411 Scotoma involving central area, right eye: Secondary | ICD-10-CM | POA: Diagnosis not present

## 2016-12-05 DIAGNOSIS — H538 Other visual disturbances: Secondary | ICD-10-CM | POA: Diagnosis not present

## 2016-12-05 DIAGNOSIS — H2513 Age-related nuclear cataract, bilateral: Secondary | ICD-10-CM | POA: Diagnosis not present

## 2017-02-06 DIAGNOSIS — J209 Acute bronchitis, unspecified: Secondary | ICD-10-CM | POA: Diagnosis not present

## 2017-07-25 ENCOUNTER — Encounter: Payer: Self-pay | Admitting: Family Medicine

## 2017-07-25 ENCOUNTER — Ambulatory Visit (INDEPENDENT_AMBULATORY_CARE_PROVIDER_SITE_OTHER): Payer: BLUE CROSS/BLUE SHIELD | Admitting: Family Medicine

## 2017-07-25 VITALS — BP 131/86 | HR 72 | Temp 97.6°F | Ht 72.0 in | Wt 250.0 lb

## 2017-07-25 DIAGNOSIS — E785 Hyperlipidemia, unspecified: Secondary | ICD-10-CM | POA: Diagnosis not present

## 2017-07-25 DIAGNOSIS — R7303 Prediabetes: Secondary | ICD-10-CM | POA: Diagnosis not present

## 2017-07-25 DIAGNOSIS — Z6833 Body mass index (BMI) 33.0-33.9, adult: Secondary | ICD-10-CM

## 2017-07-25 DIAGNOSIS — Z0000 Encounter for general adult medical examination without abnormal findings: Secondary | ICD-10-CM | POA: Diagnosis not present

## 2017-07-25 DIAGNOSIS — Z125 Encounter for screening for malignant neoplasm of prostate: Secondary | ICD-10-CM

## 2017-07-25 NOTE — Patient Instructions (Addendum)
Weight has increased - work on exercise most days per week, goal of 150 minutes minimum per week.  I will check cholesterol levels again and can see if those improve in 6 months with diet and exercise changes.  Follow up in next month if red spot on nose persists.    Keeping you healthy  Get these tests  Blood pressure- Have your blood pressure checked once a year by your healthcare provider.  Normal blood pressure is 120/80  Weight- Have your body mass index (BMI) calculated to screen for obesity.  BMI is a measure of body fat based on height and weight. You can also calculate your own BMI at [email or web address].  Cholesterol- Have your cholesterol checked every year.  Diabetes- Have your blood sugar checked regularly if you have high blood pressure, high cholesterol, have a family history of diabetes or if you are overweight.  Screening for Colon Cancer- Colonoscopy starting at age 18.  Screening may begin sooner depending on your family history and other health conditions. Follow up colonoscopy as directed by your Gastroenterologist.  Screening for Prostate Cancer- Both blood work (PSA) and a rectal exam help screen for Prostate Cancer.  Screening begins at age 27 with African-American men and at age 67 with Caucasian men.  Screening may begin sooner depending on your family history.   Take these medicines  Flu shot- Every fall.  Tetanus- Every 10 years.  Zostavax- Once after the age of 31 to prevent Shingles.  Pneumonia shot- Once after the age of 43; if you are younger than 10, ask your healthcare provider if you need a Pneumonia shot.  Take these steps  Don't smoke- If you do smoke, talk to your doctor about quitting.  For tips on how to quit, go to www.smokefree.gov or call 1-800-QUIT-NOW.  Be physically active- Exercise 5 days a week for at least 30 minutes.  If you are not already physically active start slow and gradually work up to 30 minutes of moderate physical  activity.  Examples of moderate activity include walking briskly, mowing the yard, dancing, swimming, bicycling, etc.  Eat a healthy diet- Eat a variety of healthy food such as fruits, vegetables, low fat milk, low fat cheese, yogurt, lean meant, poultry, fish, beans, tofu, etc. For more information go to www.thenutritionsource.org  Drink alcohol in moderation- Limit alcohol intake to less than two drinks a day. Never drink and drive.  Dentist- Brush and floss twice daily; visit your dentist twice a year.  Depression- Your emotional health is as important as your physical health. If you're feeling down, or losing interest in things you would normally enjoy please talk to your healthcare provider.  Eye exam- Visit your eye doctor every year.  Safe sex- If you may be exposed to a sexually transmitted infection, use a condom.  Seat belts- Seat belts can save your life; always wear one.  Smoke/Carbon Monoxide detectors- These detectors need to be installed on the appropriate level of your home.  Replace batteries at least once a year.  Skin cancer- When out in the sun, cover up and use sunscreen 15 SPF or higher.  Violence- If anyone is threatening you, please tell your healthcare provider.  Living Will/ Health care power of attorney- Speak with your healthcare provider and family.   IF you received an x-ray today, you will receive an invoice from [facility]. Please contact [facility] at [phone] with questions or concerns regarding your invoice.   IF you received labwork today,  you will receive an invoice from [place]. Please contact [facility] at [phone] with questions or concerns regarding your invoice.   Our billing staff will not be able to assist you with questions regarding bills from these companies.  You will be contacted with the lab results as soon as they are available. The fastest way to get your results is to activate your My Chart account.  Instructions are located on the last page of this paperwork. If you have not heard from [place] regarding the results in 2 weeks, please contact this office.

## 2017-07-25 NOTE — Progress Notes (Signed)
Subjective:  By signing my name below, I, Maurice Hart, attest that this documentation has been prepared under the direction and in the presence of Maurice Ray, MD. Electronically Signed: [name], [place]. 07/25/2017 , 1:49 PM .  Patient was seen in Room 1 .   Patient ID: Maurice Hart, male    DOB: November 09, 1955, 62 y.o.   MRN: [id number] Chief Complaint  Patient presents with  . Annual Exam    CPE   HPI DINNIS Hart is a 62 y.o. male Here for annual physical. His last physical was done on 07/18/16. Work has been busier this past year.   Red spot - left nostril He mentions having a small red spot on the side of his left nostril, seems to be persistent. Recheck here or dermatology in the next month if not improving.   Cancer Screening Colonoscopy: last done in Aug 2016 by Dr. Deatra Ina, moderate diverticulosis, repeat in 5 years; family history of colon cancer, brother passed from colon cancer at 37 years old.   Prostate cancer screening:  Lab Results  Component Value Date   PSA1 2.0 07/18/2016    Immunizations Immunization History  Administered Date(s) Administered  . Influenza Split 12/20/2011  . Td 02/01/2004  . Tdap 09/01/2015   Shingles vaccine: declines vaccine.   HIV screening: declines screening today; 30th anniversary this week.   Depression Depression screen [facility] 2/9 07/25/2017 07/18/2016 11/13/2015 09/01/2015 05/01/2015  Decreased Interest 0 0 0 0 0  Down, Depressed, Hopeless 0 0 0 0 0  PHQ - 2 Score 0 0 0 0 0    Vision  Visual Acuity Screening   Right eye Left eye Both eyes  Without correction:     With correction: 20/25-2 20/20 20/20   He has a cataract problem in his right eye; last seen eye doctor in Aug 2018.   Dentist He is due for cleaning, usually goes annually with cleaning every 6 months.   Exercise He fell off exercising regularly this past year. He's starting to get back to walking regularly again.   Pre diabetes Lab Results    Component Value Date   HGBA1C 6.0 (H) 07/18/2016   Wt Readings from Last 3 Encounters:  07/25/17 250 lb (113.4 kg)  07/18/16 236 lb 9.6 oz (107.3 kg)  11/13/15 240 lb (108.9 kg)    Hyperlipidemia Lab Results  Component Value Date   CHOL 168 07/18/2016   HDL 37 (L) 07/18/2016   LDLCALC 104 (H) 07/18/2016   TRIG 136 07/18/2016   CHOLHDL 4.5 07/18/2016   Lab Results  Component Value Date   ALT 16 07/18/2016   AST 18 07/18/2016   ALKPHOS 48 07/18/2016   BILITOT 0.7 07/18/2016   The 10-year ASCVD risk score [name], et al., 2013) is: 11.1%   Values used to calculate the score:     Age: 45 years     Sex: Male     Is Non-Hispanic African American: No     Diabetic: No     Tobacco smoker: No     Systolic Hart Pressure: 892 mmHg     Is BP treated: No     HDL Cholesterol: 37 mg/dL     Total Cholesterol: 168 mg/dL  He would like to work on diet and exercise before starting a statin.    Patient Active Problem List   Diagnosis Date Noted  . Flank pain 09/17/2014  . Nephrolithiasis 09/17/2014   Past Medical History:  Diagnosis  Date  . Allergy    Past Surgical History:  Procedure Laterality Date  . COLONOSCOPY     tics  . VASECTOMY  [id number]   Allergies  Allergen Reactions  . Erythromycin Diarrhea   Prior to Admission medications   Medication Sig Start Date End Date Taking? Authorizing Provider  aspirin 81 MG tablet Take 81 mg by mouth daily.    [provider]  Multiple Vitamin (MULTIVITAMIN) tablet Take 1 tablet by mouth daily.    [provider]   Social History   Socioeconomic History  . Marital status: Married    Spouse name: Not on file  . Number of children: Not on file  . Years of education: Not on file  . Highest education level: Not on file  Occupational History  . Occupation: professor    Employer: [employer]  . Financial resource strain: Not on file  . Food insecurity:    Worry: Not on file     Inability: Not on file  . Transportation needs:    Medical: Not on file    Non-medical: Not on file  Tobacco Use  . Smoking status: Never Smoker  . Smokeless tobacco: Never Used  Substance and Sexual Activity  . Alcohol use: Yes    Alcohol/week: 1.2 oz    Types: 1 Glasses of wine, 1 Cans of beer per week    Comment: 2-4 drinks  . Drug use: No  . Sexual activity: Never  Lifestyle  . Physical activity:    Days per week: Not on file    Minutes per session: Not on file  . Stress: Not on file  Relationships  . Social connections:    Talks on phone: Not on file    Gets together: Not on file    Attends religious service: Not on file    Active member of club or organization: Not on file    Attends meetings of clubs or organizations: Not on file    Relationship status: Not on file  . Intimate partner violence:    Fear of current or ex partner: Not on file    Emotionally abused: Not on file    Physically abused: Not on file    Forced sexual activity: Not on file  Other Topics Concern  . Not on file  Social History Narrative   Married. Exercise: Yes. Education: College/Other.    Review of Systems 13 point ROS - negative     Objective:   Physical Exam  Constitutional: He is oriented to person, place, and time. He appears well-developed and well-nourished.  HENT:  Head: Normocephalic and atraumatic.  Right Ear: External ear normal.  Left Ear: External ear normal.  Mouth/Throat: Oropharynx is clear and moist.  Left nostril: has a 2 mm reddened area laterally  Eyes: Pupils are equal, round, and reactive to light. Conjunctivae and EOM are normal.  Neck: Normal range of motion. Neck supple. No thyromegaly present.  Cardiovascular: Normal rate, regular rhythm, normal heart sounds and intact distal pulses.  Pulmonary/Chest: Effort normal and breath sounds normal. No respiratory distress. He has no wheezes.  Abdominal: Soft. He exhibits no distension. There is no tenderness. Hernia  confirmed negative in the right inguinal area and confirmed negative in the left inguinal area.  Genitourinary: Prostate normal.  Musculoskeletal: Normal range of motion. He exhibits no edema or tenderness.  Lymphadenopathy:    He has no cervical adenopathy.  Neurological: He is alert and oriented to person, place,  and time. He has normal reflexes.  Skin: Skin is warm and dry.  Psychiatric: He has a normal mood and affect. His behavior is normal.  Vitals reviewed.   Vitals:   07/25/17 1314  BP: 131/86  Pulse: 72  Temp: 97.6 F (36.4 C)  TempSrc: Oral  SpO2: 96%  Weight: 250 lb (113.4 kg)  Height: 6' (1.829 m)       Assessment & Plan:  DENVIL CANNING is a 62 y.o. male Annual physical exam  - -anticipatory guidance as below in AVS, screening labs above. Health maintenance items as above in HPI discussed/recommended as applicable.  Prediabetes - Plan: Hemoglobin A1c, Comprehensive metabolic panel  Hyperlipidemia, unspecified hyperlipidemia type - Plan: Lipid panel, Comprehensive metabolic panel  -ASCVD risk approach discussed.  Prior percentage discussed.  He would prefer not to take statin medication.  Weight has increased with decreased exercise.  Stressed importance of diet and exercise for both treatment of  hyperlipidemia as well as prediabetes.  Recheck levels in 6 months.  BMI 33.0-33.9,adult - Plan: Hemoglobin A1c Weight loss with diet and exercise discussed -check A1c  Screening for prostate cancer - Plan: PSA  -We discussed pros and cons of prostate cancer screening, and after this discussion, he chose to have screening done. PSA obtained, and no concerning findings on DRE.   Possible small cherry angioma on lateral left nasal ala.  Advised to follow-up within the next month if that persists or can have evaluated with his dermatologist.  No apparent  surrounding skin erythema or rash.  No orders of the defined types were placed in this encounter.  Patient Instructions      Weight has increased - work on exercise most days per week, goal of 150 minutes minimum per week.  I will check cholesterol levels again and can see if those improve in 6 months with diet and exercise changes.  Follow up in next month if red spot on nose persists.    Keeping you healthy  Get these tests  Hart pressure- Have your Hart pressure checked once a year by your healthcare provider.  Normal Hart pressure is 120/80  Weight- Have your body mass index (BMI) calculated to screen for obesity.  BMI is a measure of body fat based on height and weight. You can also calculate your own BMI at [email or web address].  Cholesterol- Have your cholesterol checked every year.  Diabetes- Have your Hart sugar checked regularly if you have high Hart pressure, high cholesterol, have a family history of diabetes or if you are overweight.  Screening for Colon Cancer- Colonoscopy starting at age 60.  Screening may begin sooner depending on your family history and other health conditions. Follow up colonoscopy as directed by your Gastroenterologist.  Screening for Prostate Cancer- Both Hart work (PSA) and a rectal exam help screen for Prostate Cancer.  Screening begins at age 30 with African-American men and at age 50 with Caucasian men.  Screening may begin sooner depending on your family history.   Take these medicines  Flu shot- Every fall.  Tetanus- Every 10 years.  Zostavax- Once after the age of 33 to prevent Shingles.  Pneumonia shot- Once after the age of 79; if you are younger than 88, ask your healthcare provider if you need a Pneumonia shot.  Take these steps  Don't smoke- If you do smoke, talk to your doctor about quitting.  For tips on how to quit, go to www.smokefree.gov or call 1-800-QUIT-NOW.  Be physically active- Exercise  5 days a week for at least 30 minutes.  If you are not already physically active start slow and gradually work up to 30 minutes of moderate  physical activity.  Examples of moderate activity include walking briskly, mowing the yard, dancing, swimming, bicycling, etc.  Eat a healthy diet- Eat a variety of healthy food such as fruits, vegetables, low fat milk, low fat cheese, yogurt, lean meant, poultry, fish, beans, tofu, etc. For more information go to www.thenutritionsource.org  Drink alcohol in moderation- Limit alcohol intake to less than two drinks a day. Never drink and drive.  Dentist- Brush and floss twice daily; visit your dentist twice a year.  Depression- Your emotional health is as important as your physical health. If you're feeling down, or losing interest in things you would normally enjoy please talk to your healthcare provider.  Eye exam- Visit your eye doctor every year.  Safe sex- If you may be exposed to a sexually transmitted infection, use a condom.  Seat belts- Seat belts can save your life; always wear one.  Smoke/Carbon Monoxide detectors- These detectors need to be installed on the appropriate level of your home.  Replace batteries at least once a year.  Skin cancer- When out in the sun, cover up and use sunscreen 15 SPF or higher.  Violence- If anyone is threatening you, please tell your healthcare provider.  Living Will/ Health care power of attorney- Speak with your healthcare provider and family.   IF you received an x-Hart today, you will receive an invoice from [facility]. Please contact [facility] at [phone] with questions or concerns regarding your invoice.   IF you received labwork today, you will receive an invoice from [facility]. Please contact [facility] at [phone] with questions or concerns regarding your invoice.   Our billing staff will not be able to assist you with questions regarding bills from these companies.  You will be contacted with the lab results as soon as they are available. The fastest way to get your results is to activate your My Chart  account. Instructions are located on the last page of this paperwork. If you have not heard from [place] regarding the results in 2 weeks, please contact this office.       I personally performed the services described in this documentation, which was scribed in my presence. The recorded information has been reviewed and considered for accuracy and completeness, addended by me as needed, and agree with information above.  Signed,   Maurice Ray, MD [facility].  07/25/17 2:24 PM

## 2017-07-26 LAB — COMPREHENSIVE METABOLIC PANEL
ALBUMIN: 4.3 g/dL (ref 3.6–4.8)
ALT: 24 IU/L (ref 0–44)
AST: 23 IU/L (ref 0–40)
Albumin/Globulin Ratio: 2 (ref 1.2–2.2)
Alkaline Phosphatase: 52 IU/L (ref 39–117)
BILIRUBIN TOTAL: 0.6 mg/dL (ref 0.0–1.2)
BUN / CREAT RATIO: 15 (ref 10–24)
BUN: 19 mg/dL (ref 8–27)
CO2: 24 mmol/L (ref 20–29)
CREATININE: 1.26 mg/dL (ref 0.76–1.27)
Calcium: 9.3 mg/dL (ref 8.6–10.2)
Chloride: 104 mmol/L (ref 96–106)
GFR calc non Af Amer: 61 mL/min/{1.73_m2} (ref >59)
GFR, EST AFRICAN AMERICAN: 70 mL/min/{1.73_m2} (ref >59)
GLOBULIN, TOTAL: 2.2 g/dL (ref 1.5–4.5)
Glucose: 105 mg/dL — ABNORMAL HIGH (ref 65–99)
Potassium: 4.3 mmol/L (ref 3.5–5.2)
SODIUM: 140 mmol/L (ref 134–144)
TOTAL PROTEIN: 6.5 g/dL (ref 6.0–8.5)

## 2017-07-26 LAB — LIPID PANEL
CHOLESTEROL TOTAL: 182 mg/dL (ref 100–199)
Chol/HDL Ratio: 5.2 ratio — ABNORMAL HIGH (ref 0.0–5.0)
HDL: 35 mg/dL — AB (ref >39)
LDL Calculated: 106 mg/dL — ABNORMAL HIGH (ref 0–99)
Triglycerides: 207 mg/dL — ABNORMAL HIGH (ref 0–149)
VLDL CHOLESTEROL CAL: 41 mg/dL — AB (ref 5–40)

## 2017-07-26 LAB — PSA: PROSTATE SPECIFIC AG, SERUM: 1.8 ng/mL (ref 0.0–4.0)

## 2017-07-26 LAB — HEMOGLOBIN A1C
ESTIMATED AVERAGE GLUCOSE: 128 mg/dL
HEMOGLOBIN A1C: 6.1 % — AB (ref 4.8–5.6)

## 2017-10-31 DIAGNOSIS — H2511 Age-related nuclear cataract, right eye: Secondary | ICD-10-CM | POA: Diagnosis not present

## 2017-10-31 DIAGNOSIS — H2513 Age-related nuclear cataract, bilateral: Secondary | ICD-10-CM | POA: Diagnosis not present

## 2017-10-31 DIAGNOSIS — H02831 Dermatochalasis of right upper eyelid: Secondary | ICD-10-CM | POA: Diagnosis not present

## 2017-10-31 DIAGNOSIS — H18413 Arcus senilis, bilateral: Secondary | ICD-10-CM | POA: Diagnosis not present

## 2017-10-31 DIAGNOSIS — H25013 Cortical age-related cataract, bilateral: Secondary | ICD-10-CM | POA: Diagnosis not present

## 2018-01-31 HISTORY — PX: CATARACT EXTRACTION, BILATERAL: SHX1313

## 2018-07-10 DIAGNOSIS — H25013 Cortical age-related cataract, bilateral: Secondary | ICD-10-CM | POA: Diagnosis not present

## 2018-07-10 DIAGNOSIS — H2513 Age-related nuclear cataract, bilateral: Secondary | ICD-10-CM | POA: Diagnosis not present

## 2018-07-10 DIAGNOSIS — H25043 Posterior subcapsular polar age-related cataract, bilateral: Secondary | ICD-10-CM | POA: Diagnosis not present

## 2018-07-10 DIAGNOSIS — H2511 Age-related nuclear cataract, right eye: Secondary | ICD-10-CM | POA: Diagnosis not present

## 2018-07-10 DIAGNOSIS — H18413 Arcus senilis, bilateral: Secondary | ICD-10-CM | POA: Diagnosis not present

## 2018-08-06 DIAGNOSIS — H2511 Age-related nuclear cataract, right eye: Secondary | ICD-10-CM | POA: Diagnosis not present

## 2018-08-06 DIAGNOSIS — H52201 Unspecified astigmatism, right eye: Secondary | ICD-10-CM | POA: Diagnosis not present

## 2018-08-07 DIAGNOSIS — H2512 Age-related nuclear cataract, left eye: Secondary | ICD-10-CM | POA: Diagnosis not present

## 2018-08-20 DIAGNOSIS — H2512 Age-related nuclear cataract, left eye: Secondary | ICD-10-CM | POA: Diagnosis not present

## 2018-08-20 DIAGNOSIS — H52202 Unspecified astigmatism, left eye: Secondary | ICD-10-CM | POA: Diagnosis not present

## 2018-10-22 ENCOUNTER — Encounter: Payer: Self-pay | Admitting: Family Medicine

## 2018-10-22 ENCOUNTER — Other Ambulatory Visit: Payer: Self-pay

## 2018-10-22 ENCOUNTER — Ambulatory Visit (INDEPENDENT_AMBULATORY_CARE_PROVIDER_SITE_OTHER): Payer: BC Managed Care – PPO | Admitting: Family Medicine

## 2018-10-22 VITALS — BP 133/82 | HR 75 | Temp 98.0°F | Wt 215.0 lb

## 2018-10-22 DIAGNOSIS — Z13220 Encounter for screening for lipoid disorders: Secondary | ICD-10-CM | POA: Diagnosis not present

## 2018-10-22 DIAGNOSIS — R7303 Prediabetes: Secondary | ICD-10-CM

## 2018-10-22 DIAGNOSIS — Z125 Encounter for screening for malignant neoplasm of prostate: Secondary | ICD-10-CM

## 2018-10-22 DIAGNOSIS — Z0001 Encounter for general adult medical examination with abnormal findings: Secondary | ICD-10-CM | POA: Diagnosis not present

## 2018-10-22 DIAGNOSIS — Z0000 Encounter for general adult medical examination without abnormal findings: Secondary | ICD-10-CM

## 2018-10-22 LAB — POCT URINALYSIS DIP (MANUAL ENTRY)
Bilirubin, UA: NEGATIVE
Blood, UA: NEGATIVE
Glucose, UA: NEGATIVE mg/dL
Ketones, POC UA: NEGATIVE mg/dL
Leukocytes, UA: NEGATIVE
Nitrite, UA: NEGATIVE
Protein Ur, POC: NEGATIVE mg/dL
Spec Grav, UA: 1.01 (ref 1.010–1.025)
Urobilinogen, UA: 0.2 E.U./dL
pH, UA: 5.5 (ref 5.0–8.0)

## 2018-10-22 NOTE — Patient Instructions (Addendum)
Keep up the good work with weight loss!   I anticipate the labs will look better.    Keeping you healthy  Get these tests  Blood pressure- Have your blood pressure checked once a year by your healthcare provider.  Normal blood pressure is 120/80  Weight- Have your body mass index (BMI) calculated to screen for obesity.  BMI is a measure of body fat based on height and weight. You can also calculate your own BMI at [email or web address].  Cholesterol- Have your cholesterol checked every year.  Diabetes- Have your blood sugar checked regularly if you have high blood pressure, high cholesterol, have a family history of diabetes or if you are overweight.  Screening for Colon Cancer- Colonoscopy starting at age 76.  Screening may begin sooner depending on your family history and other health conditions. Follow up colonoscopy as directed by your Gastroenterologist.  Screening for Prostate Cancer- Both blood work (PSA) and a rectal exam help screen for Prostate Cancer.  Screening begins at age 103 with African-American men and at age 36 with Caucasian men.  Screening may begin sooner depending on your family history.  Take these medicines  Aspirin- One aspirin daily can help prevent Heart disease and Stroke.  Flu shot- Every fall.  Tetanus- Every 10 years.  Zostavax- Once after the age of 76 to prevent Shingles.  Pneumonia shot- Once after the age of 103; if you are younger than 41, ask your healthcare provider if you need a Pneumonia shot.  Take these steps  Don't smoke- If you do smoke, talk to your doctor about quitting.  For tips on how to quit, go to www.smokefree.gov or call 1-800-QUIT-NOW.  Be physically active- Exercise 5 days a week for at least 30 minutes.  If you are not already physically active start slow and gradually work up to 30 minutes of moderate physical activity.  Examples of moderate activity include walking briskly, mowing the yard, dancing, swimming, bicycling,  etc.  Eat a healthy diet- Eat a variety of healthy food such as fruits, vegetables, low fat milk, low fat cheese, yogurt, lean meant, poultry, fish, beans, tofu, etc. For more information go to www.thenutritionsource.org  Drink alcohol in moderation- Limit alcohol intake to less than two drinks a day. Never drink and drive.  Dentist- Brush and floss twice daily; visit your dentist twice a year.  Depression- Your emotional health is as important as your physical health. If you're feeling down, or losing interest in things you would normally enjoy please talk to your healthcare provider.  Eye exam- Visit your eye doctor every year.  Safe sex- If you may be exposed to a sexually transmitted infection, use a condom.  Seat belts- Seat belts can save your life; always wear one.  Smoke/Carbon Monoxide detectors- These detectors need to be installed on the appropriate level of your home.  Replace batteries at least once a year.  Skin cancer- When out in the sun, cover up and use sunscreen 15 SPF or higher.  Violence- If anyone is threatening you, please tell your healthcare provider.  Living Will/ Health care power of attorney- Speak with your healthcare provider and family.   If you have lab work done today you will be contacted with your lab results within the next 2 weeks.  If you have not heard from [place] then please contact us. The fastest way to get your results is to register for My Chart.   IF you received an x-ray today, you will receive an  invoice from [facility]. Please contact [facility] at [phone] with questions or concerns regarding your invoice.   IF you received labwork today, you will receive an invoice from [place]. Please contact [facility] at [phone] with questions or concerns regarding your invoice.   Our billing staff will not be able to assist you with questions regarding bills from these companies.  You will be contacted with the lab results  as soon as they are available. The fastest way to get your results is to activate your My Chart account. Instructions are located on the last page of this paperwork. If you have not heard from [place] regarding the results in 2 weeks, please contact this office.

## 2018-10-22 NOTE — Progress Notes (Signed)
Subjective:    Patient ID: Maurice Hart, male    DOB: 1955-11-09, 63 y.o.   MRN: [id number]  HPI Maurice Hart is a 63 y.o. male Presents today for: Chief Complaint  Patient presents with  . Annual Exam    Here for his annual physcial.   Here for annual exam, no current prescription medications.  has taken multivitamin, turmeric - this has helped arthralgias.  Less aspirin.  Back to teaching, [employer], ventilating classroom.  Cancer screening: Colonoscopy August 2016, Dr. Deatra Ina, moderate diverticulosis, repeat 5 years.  Family history of colon cancer with his brother passing at 60 years old. New GI planned with [facility].  Prostate: Normal PSA 1.8 in June 2019. Requests testing today and aware of risks/beneifts.   Immunization History  Administered Date(s) Administered  . Influenza Split 12/20/2011  . Td 02/01/2004  . Tdap 09/01/2015  s/p 1st shingles vaccine, will be getting 2nd shingles vaccine.  Flu vaccine: plans at school.   Depression screen [facility] 2/9 10/22/2018 07/25/2017 07/18/2016 11/13/2015 09/01/2015  Decreased Interest 0 0 0 0 0  Down, Depressed, Hopeless 0 0 0 0 0  PHQ - 2 Score 0 0 0 0 0    Hearing Screening   125Hz  250Hz  500Hz  1000Hz  2000Hz  3000Hz  4000Hz  6000Hz  8000Hz   Right ear:           Left ear:             Visual Acuity Screening   Right eye Left eye Both eyes  Without correction: 20/15 20/13 20/13   With correction:     optho - cataract surgery this summer bilateral. Wears glasses for bifocal   Dental:no recent visit - delaying with pandemic.   Prediabetes: Lab Results  Component Value Date   HGBA1C 6.1 (H) 07/25/2017   Exercise: 3 days per week. Lost weight as below with diet/exercise. Low of 207.  Wt Readings from Last 3 Encounters:  10/22/18 215 lb (97.5 kg)  07/25/17 250 lb (113.4 kg)  07/18/16 236 lb 9.6 oz (107.3 kg)   Prior xiphoid injury years ago - prominent/upturned xiphoid since.   Patient Active Problem List   Diagnosis  Date Noted  . Flank pain 09/17/2014  . Nephrolithiasis 09/17/2014   Past Medical History:  Diagnosis Date  . Allergy    Past Surgical History:  Procedure Laterality Date  . COLONOSCOPY     tics  . VASECTOMY  [id number]   Allergies  Allergen Reactions  . Erythromycin Diarrhea   Prior to Admission medications   Medication Sig Start Date End Date Taking? Authorizing Provider  Multiple Vitamin (MULTIVITAMIN) tablet Take 1 tablet by mouth daily.   Yes [provider]  Turmeric (QC TUMERIC COMPLEX PO) Take by mouth.   Yes [provider]   Social History   Socioeconomic History  . Marital status: Married    Spouse name: Not on file  . Number of children: Not on file  . Years of education: Not on file  . Highest education level: Not on file  Occupational History  . Occupation: professor    Employer: [employer]  . Financial resource strain: Not on file  . Food insecurity    Worry: Not on file    Inability: Not on file  . Transportation needs    Medical: Not on file    Non-medical: Not on file  Tobacco Use  . Smoking status: Never Smoker  . Smokeless tobacco: Never Used  Substance and  Sexual Activity  . Alcohol use: Yes    Alcohol/week: 2.0 standard drinks    Types: 1 Glasses of wine, 1 Cans of beer per week    Comment: 2-4 drinks  . Drug use: No  . Sexual activity: Never  Lifestyle  . Physical activity    Days per week: Not on file    Minutes per session: Not on file  . Stress: Not on file  Relationships  . Social Herbalist on phone: Not on file    Gets together: Not on file    Attends religious service: Not on file    Active member of club or organization: Not on file    Attends meetings of clubs or organizations: Not on file    Relationship status: Not on file  . Intimate partner violence    Fear of current or ex partner: Not on file    Emotionally abused: Not on file    Physically abused: Not on file     Forced sexual activity: Not on file  Other Topics Concern  . Not on file  Social History Narrative   Married. Exercise: Yes. Education: College/Other.    Review of Systems 13 point review of systems per patient health survey noted.  Negative other than as indicated above or in HPI.      Objective:   Physical Exam Vitals signs reviewed.  Constitutional:      Appearance: He is well-developed.  HENT:     Head: Normocephalic and atraumatic.     Right Ear: External ear normal.     Left Ear: External ear normal.  Eyes:     Conjunctiva/sclera: Conjunctivae normal.     Pupils: Pupils are equal, round, and reactive to light.  Neck:     Musculoskeletal: Normal range of motion and neck supple.     Thyroid: No thyromegaly.  Cardiovascular:     Rate and Rhythm: Normal rate and regular rhythm.     Heart sounds: Normal heart sounds.  Pulmonary:     Effort: Pulmonary effort is normal. No respiratory distress.     Breath sounds: Normal breath sounds. No wheezing.  Abdominal:     General: There is no distension.     Palpations: Abdomen is soft.     Tenderness: There is no abdominal tenderness.     Hernia: There is no hernia in the left inguinal area or right inguinal area.  Genitourinary:    Prostate: Normal.  Musculoskeletal: Normal range of motion.        General: No tenderness.  Lymphadenopathy:     Cervical: No cervical adenopathy.  Skin:    General: Skin is warm and dry.  Neurological:     Mental Status: He is alert and oriented to person, place, and time.     Deep Tendon Reflexes: Reflexes are normal and symmetric.  Psychiatric:        Behavior: Behavior normal.    Vitals:   10/22/18 1429  BP: 133/82  Pulse: 75  Temp: 98 F (36.7 C)  TempSrc: Oral  SpO2: 97%  Weight: 215 lb (97.5 kg)      Assessment & Plan:  Maurice Hart is a 63 y.o. male Annual physical exam  - -anticipatory guidance as below in AVS, screening labs above. Health maintenance items as above in HPI  discussed/recommended as applicable.   Prediabetes - Plan: Hemoglobin A1c, POCT urinalysis dipstick  - anticipate improvement with weight loss - commended.   Screening for  hyperlipidemia - Plan: Comprehensive metabolic panel, Lipid Panel  Screening for prostate cancer - Plan: PSA  - We discussed pros and cons of prostate cancer screening, and after this discussion, he chose to have screening done. PSA obtained, and no concerning findings on DRE.    No orders of the defined types were placed in this encounter.  Patient Instructions   Keep up the good work with weight loss!   I anticipate the labs will look better.    Keeping you healthy  Get these tests  Blood pressure- Have your blood pressure checked once a year by your healthcare provider.  Normal blood pressure is 120/80  Weight- Have your body mass index (BMI) calculated to screen for obesity.  BMI is a measure of body fat based on height and weight. You can also calculate your own BMI at ViewBanking.si.  Cholesterol- Have your cholesterol checked every year.  Diabetes- Have your blood sugar checked regularly if you have high blood pressure, high cholesterol, have a family history of diabetes or if you are overweight.  Screening for Colon Cancer- Colonoscopy starting at age 35.  Screening may begin sooner depending on your family history and other health conditions. Follow up colonoscopy as directed by your Gastroenterologist.  Screening for Prostate Cancer- Both blood work (PSA) and a rectal exam help screen for Prostate Cancer.  Screening begins at age 39 with African-American men and at age 57 with Caucasian men.  Screening may begin sooner depending on your family history.  Take these medicines  Aspirin- One aspirin daily can help prevent Heart disease and Stroke.  Flu shot- Every fall.  Tetanus- Every 10 years.  Zostavax- Once after the age of 31 to prevent Shingles.  Pneumonia shot- Once after the age  of 31; if you are younger than 6, ask your healthcare provider if you need a Pneumonia shot.  Take these steps  Don't smoke- If you do smoke, talk to your doctor about quitting.  For tips on how to quit, go to www.smokefree.gov or call 1-800-QUIT-NOW.  Be physically active- Exercise 5 days a week for at least 30 minutes.  If you are not already physically active start slow and gradually work up to 30 minutes of moderate physical activity.  Examples of moderate activity include walking briskly, mowing the yard, dancing, swimming, bicycling, etc.  Eat a healthy diet- Eat a variety of healthy food such as fruits, vegetables, low fat milk, low fat cheese, yogurt, lean meant, poultry, fish, beans, tofu, etc. For more information go to www.thenutritionsource.org  Drink alcohol in moderation- Limit alcohol intake to less than two drinks a day. Never drink and drive.  Dentist- Brush and floss twice daily; visit your dentist twice a year.  Depression- Your emotional health is as important as your physical health. If you're feeling down, or losing interest in things you would normally enjoy please talk to your healthcare provider.  Eye exam- Visit your eye doctor every year.  Safe sex- If you may be exposed to a sexually transmitted infection, use a condom.  Seat belts- Seat belts can save your life; always wear one.  Smoke/Carbon Monoxide detectors- These detectors need to be installed on the appropriate level of your home.  Replace batteries at least once a year.  Skin cancer- When out in the sun, cover up and use sunscreen 15 SPF or higher.  Violence- If anyone is threatening you, please tell your healthcare provider.  Living Will/ Health care power of attorney- Speak with  your healthcare provider and family.   If you have lab work done today you will be contacted with your lab results within the next 2 weeks.  If you have not heard from [place] then please contact us. The fastest way to get your  results is to register for My Chart.   IF you received an x-ray today, you will receive an invoice from [facility]. Please contact [facility] at [phone] with questions or concerns regarding your invoice.   IF you received labwork today, you will receive an invoice from [facility]. Please contact [facility] at [phone] with questions or concerns regarding your invoice.   Our billing staff will not be able to assist you with questions regarding bills from these companies.  You will be contacted with the lab results as soon as they are available. The fastest way to get your results is to activate your My Chart account. Instructions are located on the last page of this paperwork. If you have not heard from [place] regarding the results in 2 weeks, please contact this office.       Signed,   [name], MD [facility].  10/22/18 3:13 PM

## 2018-10-23 LAB — HEMOGLOBIN A1C
Est. average glucose Bld gHb Est-mCnc: 120 mg/dL
Hgb A1c MFr Bld: 5.8 % — ABNORMAL HIGH (ref 4.8–5.6)

## 2018-10-23 LAB — COMPREHENSIVE METABOLIC PANEL
ALT: 18 IU/L (ref 0–44)
AST: 23 IU/L (ref 0–40)
Albumin/Globulin Ratio: 2 (ref 1.2–2.2)
Albumin: 4.3 g/dL (ref 3.8–4.8)
Alkaline Phosphatase: 56 IU/L (ref 39–117)
BUN/Creatinine Ratio: 17 (ref 10–24)
BUN: 18 mg/dL (ref 8–27)
Bilirubin Total: 0.7 mg/dL (ref 0.0–1.2)
CO2: 20 mmol/L (ref 20–29)
Calcium: 9.4 mg/dL (ref 8.6–10.2)
Chloride: 103 mmol/L (ref 96–106)
Creatinine, Ser: 1.05 mg/dL (ref 0.76–1.27)
GFR calc Af Amer: 87 mL/min/{1.73_m2} (ref >59)
GFR calc non Af Amer: 75 mL/min/{1.73_m2} (ref >59)
Globulin, Total: 2.2 g/dL (ref 1.5–4.5)
Glucose: 81 mg/dL (ref 65–99)
Potassium: 3.7 mmol/L (ref 3.5–5.2)
Sodium: 139 mmol/L (ref 134–144)
Total Protein: 6.5 g/dL (ref 6.0–8.5)

## 2018-10-23 LAB — LIPID PANEL
Chol/HDL Ratio: 3.7 ratio (ref 0.0–5.0)
Cholesterol, Total: 164 mg/dL (ref 100–199)
HDL: 44 mg/dL (ref >39)
LDL Chol Calc (NIH): 105 mg/dL — ABNORMAL HIGH (ref 0–99)
Triglycerides: 79 mg/dL (ref 0–149)
VLDL Cholesterol Cal: 15 mg/dL (ref 5–40)

## 2018-10-23 LAB — PSA: Prostate Specific Ag, Serum: 1.6 ng/mL (ref 0.0–4.0)

## 2018-11-30 DIAGNOSIS — Z20828 Contact with and (suspected) exposure to other viral communicable diseases: Secondary | ICD-10-CM | POA: Diagnosis not present

## 2019-06-11 ENCOUNTER — Telehealth: Payer: Self-pay | Admitting: Family Medicine

## 2019-06-11 NOTE — Telephone Encounter (Signed)
Please advise 

## 2019-06-11 NOTE — Telephone Encounter (Signed)
Yes, that would be fine.

## 2019-06-11 NOTE — Telephone Encounter (Signed)
Ok to schedule a 40 minute appointment to establish primary care.

## 2019-06-11 NOTE — Telephone Encounter (Signed)
Pt called stating he wanted to leave his current PCP due to the practice starting to feel impersonal and would like to know if Dr. Junius Roads would accept him?  [phone]

## 2019-07-09 ENCOUNTER — Ambulatory Visit (INDEPENDENT_AMBULATORY_CARE_PROVIDER_SITE_OTHER): Payer: 59 | Admitting: Family Medicine

## 2019-07-09 ENCOUNTER — Other Ambulatory Visit: Payer: Self-pay

## 2019-07-09 ENCOUNTER — Encounter: Payer: Self-pay | Admitting: Gastroenterology

## 2019-07-09 ENCOUNTER — Encounter: Payer: Self-pay | Admitting: Family Medicine

## 2019-07-09 VITALS — BP 115/72 | HR 66 | Ht 71.5 in | Wt 221.8 lb

## 2019-07-09 DIAGNOSIS — R7303 Prediabetes: Secondary | ICD-10-CM

## 2019-07-09 DIAGNOSIS — Z0000 Encounter for general adult medical examination without abnormal findings: Secondary | ICD-10-CM

## 2019-07-09 DIAGNOSIS — Z800 Family history of malignant neoplasm of digestive organs: Secondary | ICD-10-CM | POA: Diagnosis not present

## 2019-07-09 DIAGNOSIS — K5790 Diverticulosis of intestine, part unspecified, without perforation or abscess without bleeding: Secondary | ICD-10-CM | POA: Insufficient documentation

## 2019-07-09 NOTE — Progress Notes (Signed)
Office Visit Note   Patient: Maurice Hart           Date of Birth: 1956-01-24           MRN: [id number] Visit Date: 07/09/2019 Requested by: Wendie Agreste, MD [address] [place],  [place] [address] PCP: Wendie Agreste, MD  Subjective: Chief Complaint  Patient presents with  . annual PE    New insurance - requires new PE  . establish care    HPI: He is here to establish care.  He is here for a wellness exam.  He gets occasional pain in his right posterior lateral hip, this is happened for many years.  Is better when he walks, pain is not severe.  When he was younger he had sciatica he is not having any of that now.  Denies any back pain with it.  He walks quite a bit at work.  He has a history of prediabetes but has lost a lot of weight and is doing pretty well from that standpoint.  He is never required any medication.  He has hyperlipidemia on a mild basis, but this was better last year as well.  He has a history of diverticulosis and is due for colonoscopy again this year.  His last one was 5 years ago.  He has family history of colon cancer in his brother who died at age 26.  Otherwise he is in a good health with no new concerns.  Family history is updated in detail.  He works as a Network engineer at [employer].                ROS:   All other systems were reviewed and are negative.  Objective: Vital Signs: BP 115/72   Pulse 66   Ht 5' 11.5" (1.816 m)   Wt 221 lb 12.8 oz (100.6 kg)   BMI 30.50 kg/m   Physical Exam:  General:  Alert and oriented, in no acute distress. Pulm:  Breathing unlabored. Psy:  Normal mood, congruent affect. Skin: No suspicious lesions. HEENT:  [place]/AT, PERRLA, EOM Full, no nystagmus.  Funduscopic examination within normal limits.  No conjunctival erythema.  Tympanic membranes are pearly gray with normal landmarks.  External ear canals are normal.  Nasal passages are clear.  Oropharynx is clear.  No significant lymphadenopathy.   No thyromegaly or nodules.  2+ carotid pulses without bruits. CV: Regular rate and rhythm without murmurs, rubs, or gallops.  No peripheral edema.  2+ radial and posterior tibial pulses. Lungs: Clear to auscultation throughout with no wheezing or areas of consolidation. Abd: Bowel sounds are active, no hepatosplenomegaly or masses.  Soft and nontender.  No audible bruits.  No evidence of ascites. Right hip: He is tender on the posterior lateral greater trochanter.  He has slight pain with piriformis stretch, it actually alleviates his symptoms as well.    Imaging: No results found.  Assessment & Plan: 1.  Wellness exam -Labs to evaluate  2.  History of prediabetes -Doing well, has lost 30 pounds.  Continue with lifestyle modifications.  3.  History of diverticulosis, asymptomatic. -Colonoscopy this year.      Procedures: No procedures performed  No notes on file     PMFS History: Patient Active Problem List   Diagnosis Date Noted  . Prediabetes 07/09/2019  . Diverticulosis 07/09/2019  . Family history of colon cancer - Brother 07/09/2019  . Flank pain 09/17/2014  . Nephrolithiasis 09/17/2014   Past Medical History:  Diagnosis Date  . Allergy     Family History  Problem Relation Age of Onset  . Cancer Brother   . Colon cancer Brother   . Cancer Mother   . Diabetes Mother   . Hypertension Mother   . Breast cancer Mother   . Diabetes Father   . Cancer Father   . Heart disease Father   . Stroke Father   . Lung cancer Father   . Benign prostatic hyperplasia Father   . Diabetes Sister   . Stroke Maternal Grandfather   . Stroke Paternal Grandfather   . Cancer Maternal Grandmother        Type uncertain  . Cancer Paternal Aunt   . Breast cancer Paternal Aunt     Past Surgical History:  Procedure Laterality Date  . COLONOSCOPY     tics  . VASECTOMY  [id number]   Social History   Occupational History  . Occupation: professor    Employer: [employer]  Tobacco Use  . Smoking status: Never Smoker  . Smokeless tobacco: Never Used  Substance and Sexual Activity  . Alcohol use: Yes    Alcohol/week: 2.0 standard drinks    Types: 1 Glasses of wine, 1 Cans of beer per week    Comment: 2-4 drinks  . Drug use: No  . Sexual activity: Never

## 2019-07-10 ENCOUNTER — Telehealth: Payer: Self-pay | Admitting: Family Medicine

## 2019-07-10 LAB — COMPREHENSIVE METABOLIC PANEL
AG Ratio: 2.2 (calc) (ref 1.0–2.5)
ALT: 13 U/L (ref 9–46)
AST: 17 U/L (ref 10–35)
Albumin: 4.2 g/dL (ref 3.6–5.1)
Alkaline phosphatase (APISO): 42 U/L (ref 35–144)
BUN: 18 mg/dL (ref 7–25)
CO2: 24 mmol/L (ref 20–32)
Calcium: 9 mg/dL (ref 8.6–10.3)
Chloride: 108 mmol/L (ref 98–110)
Creat: 1.08 mg/dL (ref 0.70–1.25)
Globulin: 1.9 g/dL (calc) (ref 1.9–3.7)
Glucose, Bld: 108 mg/dL — ABNORMAL HIGH (ref 65–99)
Potassium: 4.3 mmol/L (ref 3.5–5.3)
Sodium: 139 mmol/L (ref 135–146)
Total Bilirubin: 0.7 mg/dL (ref 0.2–1.2)
Total Protein: 6.1 g/dL (ref 6.1–8.1)

## 2019-07-10 LAB — HEMOGLOBIN A1C
Hgb A1c MFr Bld: 5.5 % of total Hgb (ref <5.7)
Mean Plasma Glucose: 111 (calc)
eAG (mmol/L): 6.2 (calc)

## 2019-07-10 LAB — CBC WITH DIFFERENTIAL/PLATELET
Absolute Monocytes: 364 cells/uL (ref 200–950)
Basophils Absolute: 41 cells/uL (ref 0–200)
Basophils Relative: 1.2 %
Eosinophils Absolute: 122 cells/uL (ref 15–500)
Eosinophils Relative: 3.6 %
HCT: 43.7 % (ref 38.5–50.0)
Hemoglobin: 14.7 g/dL (ref 13.2–17.1)
Lymphs Abs: 1125 cells/uL (ref 850–3900)
MCH: 29.8 pg (ref 27.0–33.0)
MCHC: 33.6 g/dL (ref 32.0–36.0)
MCV: 88.5 fL (ref 80.0–100.0)
MPV: 10.4 fL (ref 7.5–12.5)
Monocytes Relative: 10.7 %
Neutro Abs: 1748 cells/uL (ref 1500–7800)
Neutrophils Relative %: 51.4 %
Platelets: 170 10*3/uL (ref 140–400)
RBC: 4.94 10*6/uL (ref 4.20–5.80)
RDW: 12.3 % (ref 11.0–15.0)
Total Lymphocyte: 33.1 %
WBC: 3.4 10*3/uL — ABNORMAL LOW (ref 3.8–10.8)

## 2019-07-10 LAB — PSA: PSA: 1.3 ng/mL (ref <=4.0)

## 2019-07-10 LAB — LIPID PANEL
Cholesterol: 167 mg/dL (ref <200)
HDL: 40 mg/dL (ref >=40)
LDL Cholesterol (Calc): 110 mg/dL (calc) — ABNORMAL HIGH
Non-HDL Cholesterol (Calc): 127 mg/dL (calc) (ref <130)
Total CHOL/HDL Ratio: 4.2 (calc) (ref <5.0)
Triglycerides: 78 mg/dL (ref <150)

## 2019-07-10 NOTE — Telephone Encounter (Signed)
Labs are notable for the following:  Prostate PSA looks good at 1.3.  Blood glucose is still in prediabetes range at 108, but hemoglobin A1c looks excellent at 5.5.  Lipid panel looks good.  All else looks good.

## 2019-07-22 ENCOUNTER — Encounter: Payer: BC Managed Care – PPO | Admitting: Family Medicine

## 2019-07-23 ENCOUNTER — Encounter: Payer: BC Managed Care – PPO | Admitting: Family Medicine

## 2019-08-05 ENCOUNTER — Encounter: Payer: Self-pay | Admitting: Family Medicine

## 2019-08-05 DIAGNOSIS — R350 Frequency of micturition: Secondary | ICD-10-CM

## 2019-08-06 ENCOUNTER — Other Ambulatory Visit: Payer: Self-pay

## 2019-08-06 ENCOUNTER — Encounter: Payer: Self-pay | Admitting: Family Medicine

## 2019-08-06 ENCOUNTER — Ambulatory Visit (INDEPENDENT_AMBULATORY_CARE_PROVIDER_SITE_OTHER): Payer: 59 | Admitting: Family Medicine

## 2019-08-06 DIAGNOSIS — M25512 Pain in left shoulder: Secondary | ICD-10-CM

## 2019-08-06 DIAGNOSIS — S060X9A Concussion with loss of consciousness of unspecified duration, initial encounter: Secondary | ICD-10-CM | POA: Diagnosis not present

## 2019-08-06 DIAGNOSIS — I4510 Unspecified right bundle-branch block: Secondary | ICD-10-CM | POA: Diagnosis not present

## 2019-08-06 DIAGNOSIS — R918 Other nonspecific abnormal finding of lung field: Secondary | ICD-10-CM | POA: Diagnosis not present
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
## 2019-08-08 ENCOUNTER — Ambulatory Visit: Payer: 59 | Admitting: Family Medicine

## 2019-08-12 ENCOUNTER — Telehealth: Payer: Self-pay | Admitting: *Deleted

## 2019-08-12 NOTE — Addendum Note (Signed)
Addended by: Hortencia Pilar on: 08/12/2019 09:09 AM   Modules accepted: Orders

## 2019-08-12 NOTE — Telephone Encounter (Signed)
I would have him follow up with Cardiology first. If they see him and they do not plan any additional evaluation, then I am OK to proceed with his scheduled colonoscopy. Otherwise, in setting of this being a surveillance without symptoms then OK to hold and reschedule. Thank you. GM
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
## 2019-08-12 NOTE — Telephone Encounter (Signed)
Thank you for update. GM 

## 2019-08-14 ENCOUNTER — Ambulatory Visit (INDEPENDENT_AMBULATORY_CARE_PROVIDER_SITE_OTHER): Payer: 59

## 2019-08-14 ENCOUNTER — Other Ambulatory Visit: Payer: Self-pay

## 2019-08-14 DIAGNOSIS — R350 Frequency of micturition: Secondary | ICD-10-CM

## 2019-08-14 NOTE — Progress Notes (Signed)
Lab visit: urine collected and sent for UA w/micro & reflex cx per Dr. Junius Roads.

## 2019-08-14 NOTE — Progress Notes (Signed)
He is here for a nurse visit today but asked me to look at his abdomen.  He noticed when he was in a recliner that there was a tender area in the right side of his abdomen and he felt like there was a defect in the muscle.  He had problems in this area before.  Examination today reveals a small defect in the abdominal muscle, possible hernia.  It is slightly tender to palpation.  There is nothing protruding from it.  We will continue to monitor.

## 2019-08-15 ENCOUNTER — Telehealth: Payer: Self-pay | Admitting: Family Medicine

## 2019-08-15 LAB — URINALYSIS W MICROSCOPIC + REFLEX CULTURE
Bacteria, UA: NONE SEEN /HPF
Bilirubin Urine: NEGATIVE
Glucose, UA: NEGATIVE
Hgb urine dipstick: NEGATIVE
Hyaline Cast: NONE SEEN /LPF
Ketones, ur: NEGATIVE
Leukocyte Esterase: NEGATIVE
Nitrites, Initial: NEGATIVE
Protein, ur: NEGATIVE
RBC / HPF: NONE SEEN /HPF (ref 0–2)
Specific Gravity, Urine: 1.021 (ref 1.001–1.03)
Squamous Epithelial / HPF: NONE SEEN /HPF (ref <=5)
WBC, UA: NONE SEEN /HPF (ref 0–5)
pH: 6.5 (ref 5.0–8.0)

## 2019-08-15 LAB — NO CULTURE INDICATED

## 2019-08-15 NOTE — Telephone Encounter (Signed)
Urine looks normal

## 2019-08-19 ENCOUNTER — Ambulatory Visit (INDEPENDENT_AMBULATORY_CARE_PROVIDER_SITE_OTHER): Payer: 59 | Admitting: Diagnostic Neuroimaging

## 2019-08-19 ENCOUNTER — Encounter: Payer: Self-pay | Admitting: Diagnostic Neuroimaging

## 2019-08-19 VITALS — BP 143/92 | HR 72 | Ht 72.0 in | Wt 225.0 lb

## 2019-08-19 DIAGNOSIS — M25512 Pain in left shoulder: Secondary | ICD-10-CM

## 2019-08-19 DIAGNOSIS — F0781 Postconcussional syndrome: Secondary | ICD-10-CM
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
## 2019-08-26 ENCOUNTER — Other Ambulatory Visit: Payer: Self-pay | Admitting: Family Medicine

## 2019-08-26 DIAGNOSIS — H9190 Unspecified hearing loss, unspecified ear: Secondary | ICD-10-CM

## 2019-08-27 ENCOUNTER — Ambulatory Visit (INDEPENDENT_AMBULATORY_CARE_PROVIDER_SITE_OTHER): Payer: 59 | Admitting: Family Medicine

## 2019-08-27 ENCOUNTER — Other Ambulatory Visit: Payer: Self-pay

## 2019-08-27 ENCOUNTER — Encounter: Payer: Self-pay | Admitting: Family Medicine

## 2019-08-27 DIAGNOSIS — S20219D Contusion of unspecified front wall of thorax, subsequent encounter: Secondary | ICD-10-CM

## 2019-08-27 DIAGNOSIS — S060X9D Concussion with loss of consciousness of unspecified duration, subsequent encounter: Secondary | ICD-10-CM

## 2019-08-27 DIAGNOSIS — M25561 Pain in right knee: Secondary | ICD-10-CM

## 2019-08-27 DIAGNOSIS — M25512 Pain in left shoulder: Secondary | ICD-10-CM | POA: Diagnosis not present
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
## 2019-09-03 ENCOUNTER — Encounter: Payer: 59 | Admitting: Gastroenterology

## 2019-09-04 ENCOUNTER — Other Ambulatory Visit: Payer: Self-pay

## 2019-09-04 ENCOUNTER — Ambulatory Visit: Payer: 59 | Attending: Audiologist | Admitting: Audiologist

## 2019-09-04 DIAGNOSIS — H9193 Unspecified hearing loss, bilateral: Secondary | ICD-10-CM | POA: Diagnosis not present
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
## 2019-09-24 ENCOUNTER — Ambulatory Visit: Payer: 59 | Admitting: Family Medicine

## 2019-09-27 ENCOUNTER — Encounter: Payer: Self-pay | Admitting: Family Medicine

## 2019-09-27 ENCOUNTER — Ambulatory Visit (INDEPENDENT_AMBULATORY_CARE_PROVIDER_SITE_OTHER): Payer: 59 | Admitting: Family Medicine

## 2019-09-27 ENCOUNTER — Other Ambulatory Visit: Payer: Self-pay

## 2019-09-27 VITALS — Ht 72.0 in | Wt 225.0 lb

## 2019-09-27 DIAGNOSIS — M25561 Pain in right knee: Secondary | ICD-10-CM

## 2019-09-27 DIAGNOSIS — M25512 Pain in left shoulder: Secondary | ICD-10-CM | POA: Diagnosis not present
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
## 2019-10-02 ENCOUNTER — Telehealth: Payer: Self-pay | Admitting: Family Medicine

## 2019-10-02 NOTE — Telephone Encounter (Signed)
Ov notes & labs faxed to [facility] [phone]
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
## 2019-10-11 ENCOUNTER — Encounter: Payer: Self-pay | Admitting: Cardiovascular Disease

## 2019-10-11 ENCOUNTER — Other Ambulatory Visit: Payer: Self-pay

## 2019-10-11 ENCOUNTER — Ambulatory Visit (INDEPENDENT_AMBULATORY_CARE_PROVIDER_SITE_OTHER): Payer: 59 | Admitting: Cardiovascular Disease

## 2019-10-11 VITALS — BP 142/88 | HR 68 | Ht 72.0 in | Wt 231.4 lb

## 2019-10-11 DIAGNOSIS — R9431 Abnormal electrocardiogram [ECG] [EKG]: Secondary | ICD-10-CM

## 2019-10-11 NOTE — Patient Instructions (Addendum)
Medication Instructions:  *If you need a refill on your cardiac medications before your next appointment, please call your pharmacy*  Lab Work: If you have labs (blood work) drawn today and your tests are completely normal, you will receive your results only by:  [place] (if you have MyChart) OR  A paper copy in the mail If you have any lab test that is abnormal or we need to change your treatment, we will call you to review the results.  Testing/Procedures: Cardiac CT scanning for calcium score, (CAT scanning), is a noninvasive, special x-ray that produces cross-sectional images of the body using x-rays and a computer. CT scans help physicians diagnose and treat medical conditions. For some CT exams, a contrast material is used to enhance visibility in the area of the body being studied. CT scans provide greater clarity and reveal more details than regular x-ray exams.  Follow-Up: At [facility], you and your health needs are our priority.  As part of our continuing mission to provide you with exceptional heart care, we have created designated Provider Care Teams.  These Care Teams include your primary Cardiologist (physician) and Advanced Practice Providers (APPs -  Physician Assistants and Nurse Practitioners) who all work together to provide you with the care you need, when you need it.  We recommend signing up for the patient portal called "MyChart".  Sign up information is provided on this After Visit Summary.  MyChart is used to connect with patients for Virtual Visits (Telemedicine).  Patients are able to view lab/test results, encounter notes, upcoming appointments, etc.  Non-urgent messages can be sent to your provider as well.   To learn more about what you can do with MyChart, go to [email or web address].    Your next appointment:   As needed  The format for your next appointment:   In Person  Provider:   You may see Dr. Johnsie Cancel or one of the following Advanced  Practice Providers on your designated Care Team:    Truitt Merle, NP  Cecilie Kicks, NP  Kathyrn Drown, NP

## 2019-10-28 ENCOUNTER — Ambulatory Visit (INDEPENDENT_AMBULATORY_CARE_PROVIDER_SITE_OTHER)
Admission: RE | Admit: 2019-10-28 | Discharge: 2019-10-28 | Disposition: A | Discharge status: Home / Self Care | Payer: Self-pay | Source: Ambulatory Visit | Attending: Cardiovascular Disease | Admitting: Cardiovascular Disease

## 2019-10-28 ENCOUNTER — Other Ambulatory Visit: Payer: Self-pay

## 2019-10-28 DIAGNOSIS — R9431 Abnormal electrocardiogram [ECG] [EKG]: Secondary | ICD-10-CM

## 2019-11-13 ENCOUNTER — Encounter: Payer: Self-pay | Admitting: Gastroenterology

## 2019-11-14 ENCOUNTER — Encounter: Payer: Self-pay | Admitting: Gastroenterology

## 2019-12-30 ENCOUNTER — Other Ambulatory Visit: Payer: Self-pay

## 2019-12-30 ENCOUNTER — Ambulatory Visit (AMBULATORY_SURGERY_CENTER): Payer: Self-pay

## 2019-12-30 VITALS — Ht 72.0 in | Wt 235.8 lb

## 2019-12-30 DIAGNOSIS — Z800 Family history of malignant neoplasm of digestive organs: Secondary | ICD-10-CM

## 2019-12-30 NOTE — Progress Notes (Signed)
No egg or soy allergy known to patient  No issues with past sedation with any surgeries or procedures No intubation problems in the past  No FH of Malignant Hyperthermia No diet pills per patient No home 02 use per patient  No blood thinners per patient  Pt denies issues with constipation  No A fib or A flutter  EMMI video via MyChart  COVID 19 guidelines implemented in PV today with Pt and RN  Coupon given to pt in PV today , Code to Pharmacy  COVID vaccines completed on 05/2019 per pt;  Due to the COVID-19 pandemic we are asking patients to follow these guidelines. Please only bring one care partner. Please be aware that your care partner may wait in the car in the parking lot or if they feel like they will be too hot to wait in the car, they may wait in the lobby on the 4th floor. All care partners are required to wear a mask the entire time (we do not have any that we can provide them), they need to practice social distancing, and we will do a Covid check for all patient's and care partners when you arrive. Also we will check their temperature and your temperature. If the care partner waits in their car they need to stay in the parking lot the entire time and we will call them on their cell phone when the patient is ready for discharge so they can bring the car to the front of the building. Also all patient's will need to wear a mask into building.  

## 2020-01-06 ENCOUNTER — Other Ambulatory Visit: Payer: Self-pay

## 2020-01-06 ENCOUNTER — Ambulatory Visit (INDEPENDENT_AMBULATORY_CARE_PROVIDER_SITE_OTHER): Payer: 59 | Admitting: Orthopedic Surgery

## 2020-01-06 ENCOUNTER — Encounter: Payer: Self-pay | Admitting: Orthopedic Surgery

## 2020-01-06 ENCOUNTER — Ambulatory Visit: Payer: Self-pay

## 2020-01-06 VITALS — Ht 72.0 in | Wt 229.0 lb

## 2020-01-06 DIAGNOSIS — M25512 Pain in left shoulder: Secondary | ICD-10-CM | POA: Diagnosis not present

## 2020-01-10 ENCOUNTER — Encounter: Payer: Self-pay | Admitting: Orthopedic Surgery
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
## 2020-01-15 ENCOUNTER — Encounter: Payer: Self-pay | Admitting: Gastroenterology

## 2020-01-15 ENCOUNTER — Ambulatory Visit (AMBULATORY_SURGERY_CENTER): Payer: 59 | Admitting: Gastroenterology

## 2020-01-15 ENCOUNTER — Other Ambulatory Visit: Payer: Self-pay

## 2020-01-15 VITALS — BP 100/62 | HR 62 | Temp 98.0°F | Resp 14 | Ht 72.0 in | Wt 235.8 lb

## 2020-01-15 DIAGNOSIS — Z1211 Encounter for screening for malignant neoplasm of colon: Secondary | ICD-10-CM | POA: Diagnosis not present

## 2020-01-15 DIAGNOSIS — D122 Benign neoplasm of ascending colon: Secondary | ICD-10-CM

## 2020-01-15 DIAGNOSIS — Z800 Family history of malignant neoplasm of digestive organs: Secondary | ICD-10-CM

## 2020-01-15 DIAGNOSIS — K635 Polyp of colon: Secondary | ICD-10-CM

## 2020-01-15 HISTORY — PX: COLONOSCOPY WITH PROPOFOL: SHX5780

## 2020-01-15 MED ORDER — SODIUM CHLORIDE 0.9 % IV SOLN: 500.0000 mL | Freq: Once | INTRAVENOUS | Status: DC

## 2020-01-15 NOTE — Progress Notes (Signed)
Medical history reviewed with no changes noted. VS assessed by C.W 

## 2020-01-15 NOTE — Progress Notes (Signed)
PT taken to PACU. Monitors in place. VSS. Report given to RN. 

## 2020-01-15 NOTE — Op Note (Signed)
[facility] Patient Name: Maurice Hart Procedure Date: 01/15/2020 1:36 PM MRN: [id number] Endoscopist: Justice Britain , MD Age: 64 Referring MD:  Date of Birth: 22-Oct-1955 Gender: Male Account #: [id number] Procedure:                Colonoscopy Indications:              Screening in patient at increased risk: Colorectal                            cancer in brother before age 54 Medicines:                Monitored Anesthesia Care Procedure:                Pre-Anesthesia Assessment:                           - Prior to the procedure, a History and Physical                            was performed, and patient medications and                            allergies were reviewed. The patient's tolerance of                            previous anesthesia was also reviewed. The risks                            and benefits of the procedure and the sedation                            options and risks were discussed with the patient.                            All questions were answered, and informed consent                            was obtained. Prior Anticoagulants: The patient has                            taken no previous anticoagulant or antiplatelet                            agents except for NSAID medication. ASA Grade                            Assessment: II - A patient with mild systemic                            disease. After reviewing the risks and benefits,                            the patient was deemed in satisfactory condition to  undergo the procedure.                           After obtaining informed consent, the colonoscope                            was passed under direct vision. Throughout the                            procedure, the patient's blood pressure, pulse, and                            oxygen saturations were monitored continuously. The                            Olympus CF-HQ190L ([id number]) Colonoscope was                             introduced through the anus and advanced to the 5                            cm into the ileum. The colonoscopy was performed                            without difficulty. The patient tolerated the                            procedure. The quality of the bowel preparation was                            adequate. The terminal ileum, ileocecal valve,                            appendiceal orifice, and rectum were photographed. Scope In: 1:42:55 PM Scope Out: 2:15:46 PM Scope Withdrawal Time: 0 hours 28 minutes 42 seconds  Total Procedure Duration: 0 hours 32 minutes 51 seconds  Findings:                 The digital rectal exam findings include                            hemorrhoids. Pertinent negatives include no                            palpable rectal lesions.                           The terminal ileum and ileocecal valve appeared                            normal.                           A 25 mm polyp was found in the mid ascending colon.  The polyp was granular lateral spreading.                            Preparations were made for mucosal resection. NBI                            imaging and White-light endoscopy was done to                            demarcate the borders of the lesion. Saline was                            injected to raise the lesion. Piecemeal mucosal                            resection using a snare was performed. Resection                            and retrieval were complete. To prevent bleeding                            after mucosal resection, three hemostatic clips                            were successfully placed (MR conditional). There                            was no bleeding at the end of the procedure. Area                            on contralateral wall was tattooed with an                            injection of Spot (carbon black) for marking                            purposes.                            Many small and large-mouthed diverticula were found                            in the recto-sigmoid colon, sigmoid colon and                            descending colon.                           Normal mucosa was found in the entire colon                            otherwise.                           Non-bleeding non-thrombosed external and internal  hemorrhoids were found during retroflexion, during                            perianal exam and during digital exam. The                            hemorrhoids were Grade II (internal hemorrhoids                            that prolapse but reduce spontaneously). Complications:            No immediate complications. Estimated Blood Loss:     Estimated blood loss was minimal. Impression:               - Hemorrhoids found on digital rectal exam.                           - The examined portion of the ileum was normal.                           - One 25 mm polyp in the mid ascending colon,                            removed with piecemeal cold mucosal resection.                            Resected and retrieved. Clips (MR conditional) were                            placed. Tattooed.                           - Diverticulosis in the recto-sigmoid colon, in the                            sigmoid colon and in the descending colon.                           - Normal mucosa in the entire examined colon                            otherwise.                           - Non-bleeding non-thrombosed external and internal                            hemorrhoids. Recommendation:           - The patient will be observed post-procedure,                            until all discharge criteria are met.                           - Discharge patient to home.                           -  Patient has a contact number available for                            emergencies. The signs and symptoms of potential                             delayed complications were discussed with the                            patient. Return to normal activities tomorrow.                            Written discharge instructions were provided to the                            patient.                           - High fiber diet.                           - No aspirin, ibuprofen, naproxen, or other                            non-steroidal anti-inflammatory drugs for 2 weeks                            after polyp removal to decrease risk of bleeding                            post-intervention.                           - Continue present medications.                           - Await pathology results.                           - Repeat colonoscopy in 1 year for surveillance as                            a result of piecemeal resection. If all is well,                            then 3-year follow up will be recommended                            thereafter.                           - The findings and recommendations were discussed                            with the patient.                           -  The findings and recommendations were discussed                            with the patient's family. Justice Britain, MD 01/15/2020 2:22:19 PM

## 2020-01-15 NOTE — Progress Notes (Signed)
Called to room to assist during endoscopic procedure.  Patient ID and intended procedure confirmed with present staff. Received instructions for my participation in the procedure from the performing physician.  
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
## 2020-01-17 ENCOUNTER — Telehealth: Payer: Self-pay

## 2020-01-17 NOTE — Telephone Encounter (Signed)
  Follow up Call-  Call back number 01/15/2020  Post procedure Call Back phone  # [phone]  Permission to leave phone message Yes  Some recent data might be hidden     Patient questions:  Do you have a fever, pain , or abdominal swelling? No. Pain Score  0 *  Have you tolerated food without any problems? Yes.    Have you been able to return to your normal activities? Yes.    Do you have any questions about your discharge instructions: Diet   No. Medications  No. Follow up visit  No.  Do you have questions or concerns about your Care? No.  Actions: * If pain score is 4 or above: No action needed, pain <4.  1. Have you developed a fever since your procedure? no  2.   Have you had an respiratory symptoms (SOB or cough) since your procedure? no  3.   Have you tested positive for COVID 19 since your procedure no  4.   Have you had any family members/close contacts diagnosed with the COVID 19 since your procedure?  no   If yes to any of these questions please route to Joylene Develle, RN and Joella Prince, RN

## 2020-01-27 ENCOUNTER — Encounter: Payer: Self-pay | Admitting: Gastroenterology

## 2020-05-20 ENCOUNTER — Ambulatory Visit (INDEPENDENT_AMBULATORY_CARE_PROVIDER_SITE_OTHER): Payer: 59 | Admitting: Orthopedic Surgery

## 2020-05-20 DIAGNOSIS — M25512 Pain in left shoulder: Secondary | ICD-10-CM

## 2020-05-24 ENCOUNTER — Encounter: Payer: Self-pay | Admitting: Orthopedic Surgery
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
## 2020-07-09 ENCOUNTER — Ambulatory Visit: Payer: 59 | Admitting: Family Medicine

## 2020-07-10 ENCOUNTER — Encounter: Payer: Self-pay | Admitting: Family Medicine

## 2020-07-10 ENCOUNTER — Ambulatory Visit (INDEPENDENT_AMBULATORY_CARE_PROVIDER_SITE_OTHER): Payer: 59 | Admitting: Family Medicine

## 2020-07-10 VITALS — BP 121/77 | HR 80 | Ht 72.0 in | Wt 230.4 lb

## 2020-07-10 DIAGNOSIS — N200 Calculus of kidney: Secondary | ICD-10-CM

## 2020-07-10 DIAGNOSIS — Z0000 Encounter for general adult medical examination without abnormal findings: Secondary | ICD-10-CM | POA: Diagnosis not present

## 2020-07-10 DIAGNOSIS — K5790 Diverticulosis of intestine, part unspecified, without perforation or abscess without bleeding: Secondary | ICD-10-CM | POA: Diagnosis not present

## 2020-07-10 DIAGNOSIS — Z800 Family history of malignant neoplasm of digestive organs: Secondary | ICD-10-CM | POA: Diagnosis not present

## 2020-07-10 DIAGNOSIS — R7303 Prediabetes: Secondary | ICD-10-CM | POA: Diagnosis not present

## 2020-07-10 DIAGNOSIS — E785 Hyperlipidemia, unspecified: Secondary | ICD-10-CM
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
## 2020-07-11 LAB — CBC WITH DIFFERENTIAL/PLATELET
Absolute Monocytes: 386 cells/uL (ref 200–950)
Basophils Absolute: 39 cells/uL (ref 0–200)
Basophils Relative: 1 %
Eosinophils Absolute: 152 cells/uL (ref 15–500)
Eosinophils Relative: 3.9 %
HCT: 45.6 % (ref 38.5–50.0)
Hemoglobin: 15.3 g/dL (ref 13.2–17.1)
Lymphs Abs: 1147 cells/uL (ref 850–3900)
MCH: 29.7 pg (ref 27.0–33.0)
MCHC: 33.6 g/dL (ref 32.0–36.0)
MCV: 88.5 fL (ref 80.0–100.0)
MPV: 9.9 fL (ref 7.5–12.5)
Monocytes Relative: 9.9 %
Neutro Abs: 2176 cells/uL (ref 1500–7800)
Neutrophils Relative %: 55.8 %
Platelets: 187 10*3/uL (ref 140–400)
RBC: 5.15 10*6/uL (ref 4.20–5.80)
RDW: 12.4 % (ref 11.0–15.0)
Total Lymphocyte: 29.4 %
WBC: 3.9 10*3/uL (ref 3.8–10.8)

## 2020-07-11 LAB — LIPID PANEL
Cholesterol: 188 mg/dL (ref <200)
HDL: 41 mg/dL (ref >=40)
LDL Cholesterol (Calc): 121 mg/dL (calc) — ABNORMAL HIGH
Non-HDL Cholesterol (Calc): 147 mg/dL (calc) — ABNORMAL HIGH (ref <130)
Total CHOL/HDL Ratio: 4.6 (calc) (ref <5.0)
Triglycerides: 148 mg/dL (ref <150)

## 2020-07-11 LAB — COMPREHENSIVE METABOLIC PANEL
AG Ratio: 1.9 (calc) (ref 1.0–2.5)
ALT: 20 U/L (ref 9–46)
AST: 18 U/L (ref 10–35)
Albumin: 4.3 g/dL (ref 3.6–5.1)
Alkaline phosphatase (APISO): 43 U/L (ref 35–144)
BUN: 17 mg/dL (ref 7–25)
CO2: 22 mmol/L (ref 20–32)
Calcium: 9.6 mg/dL (ref 8.6–10.3)
Chloride: 106 mmol/L (ref 98–110)
Creat: 1.19 mg/dL (ref 0.70–1.25)
Globulin: 2.3 g/dL (calc) (ref 1.9–3.7)
Glucose, Bld: 117 mg/dL — ABNORMAL HIGH (ref 65–99)
Potassium: 4.1 mmol/L (ref 3.5–5.3)
Sodium: 138 mmol/L (ref 135–146)
Total Bilirubin: 0.9 mg/dL (ref 0.2–1.2)
Total Protein: 6.6 g/dL (ref 6.1–8.1)

## 2020-07-11 LAB — HEMOGLOBIN A1C
Hgb A1c MFr Bld: 5.7 % of total Hgb — ABNORMAL HIGH (ref <5.7)
Mean Plasma Glucose: 117 mg/dL
eAG (mmol/L): 6.5 mmol/L

## 2020-07-11 LAB — THYROID PANEL WITH TSH
Free Thyroxine Index: 2.3 (ref 1.4–3.8)
T3 Uptake: 32 % (ref 22–35)
T4, Total: 7.3 ug/dL (ref 4.9–10.5)
TSH: 2.21 mIU/L (ref 0.40–4.50)

## 2020-07-11 LAB — PSA: PSA: 2.98 ng/mL (ref <=4.00)

## 2020-07-11 LAB — HIGH SENSITIVITY CRP: hs-CRP: 1.3 mg/L

## 2020-07-13 ENCOUNTER — Telehealth: Payer: Self-pay | Admitting: Family Medicine

## 2020-07-13 NOTE — Telephone Encounter (Signed)
Labs are notable for the following:  Blood glucose is in prediabetes range at 117.  Hemoglobin A1c is borderline at 5.7.  All other labs look good.  It is important to maintain a regular exercise regimen and to minimize dietary intake of processed carbohydrates including breads, pastas, cereals, sugars and sweets.  Recheck glucose and A1c in about 6 months.

## 2020-07-16 ENCOUNTER — Telehealth: Payer: Self-pay | Admitting: Orthopedic Surgery

## 2020-07-16 NOTE — Telephone Encounter (Signed)
Please copy 01/09/20 xray to CD. I need for requesting atty. Let me know when ready, I'll pick up. Thanks!

## 2020-07-29 ENCOUNTER — Ambulatory Visit: Payer: 59 | Attending: Internal Medicine

## 2020-07-29 DIAGNOSIS — Z23 Encounter for immunization: Secondary | ICD-10-CM

## 2020-07-29 NOTE — Progress Notes (Signed)
   Covid-19 Vaccination Clinic  Name:  Maurice Hart    MRN: [id number] DOB: May 01, 1955  07/29/2020  Maurice Hart was observed post Covid-19 immunization for 15 minutes without incident. He was provided with Vaccine Information Sheet and instruction to access the V-Safe system.   Maurice Hart was instructed to call 911 with any severe reactions post vaccine: Difficulty breathing  Swelling of face and throat  A fast heartbeat  A bad rash all over body  Dizziness and weakness   Immunizations Administered     Name Date Dose VIS Date Route   PFIZER Comrnaty(Gray TOP) Covid-19 Vaccine 07/29/2020 11:47 AM 0.3 mL 01/09/2020 Intramuscular   Manufacturer: [employer]   Lot: [id number]   [place]: [phone]

## 2020-07-31 ENCOUNTER — Other Ambulatory Visit (HOSPITAL_BASED_OUTPATIENT_CLINIC_OR_DEPARTMENT_OTHER): Payer: Self-pay

## 2020-07-31 MED ORDER — COVID-19 MRNA VAC-TRIS(PFIZER) 30 MCG/0.3ML IM SUSP
INTRAMUSCULAR | 0 refills | Status: DC
Start: 2020-07-29 — End: 2021-02-09
  Filled 2020-07-31: qty 0.3, 1d supply, fill #0

## 2020-08-05 ENCOUNTER — Telehealth: Payer: Self-pay | Admitting: Family Medicine

## 2020-08-05 NOTE — Telephone Encounter (Signed)
Received vm from [place] w/ Law office Sigurd Sos Farrin checking status of film request. IC,lmvm advised mailed 7/1. Ph [phone]

## 2020-10-12 ENCOUNTER — Other Ambulatory Visit (HOSPITAL_BASED_OUTPATIENT_CLINIC_OR_DEPARTMENT_OTHER): Payer: Self-pay

## 2020-12-22 ENCOUNTER — Encounter: Payer: Self-pay | Admitting: Gastroenterology

## 2020-12-28 ENCOUNTER — Other Ambulatory Visit (HOSPITAL_BASED_OUTPATIENT_CLINIC_OR_DEPARTMENT_OTHER): Payer: Self-pay

## 2020-12-28 ENCOUNTER — Other Ambulatory Visit: Payer: Self-pay

## 2020-12-28 ENCOUNTER — Ambulatory Visit: Payer: 59 | Attending: Internal Medicine

## 2020-12-28 DIAGNOSIS — Z23 Encounter for immunization: Secondary | ICD-10-CM

## 2020-12-28 MED ORDER — PFIZER COVID-19 VAC BIVALENT 30 MCG/0.3ML IM SUSP
INTRAMUSCULAR | 0 refills | Status: DC
  Filled 2020-12-28: qty 0.3, 1d supply, fill #0

## 2020-12-28 NOTE — Progress Notes (Signed)
   Covid-19 Vaccination Clinic  Name:  Maurice Hart    MRN: [id number] DOB: 07/04/1955  12/28/2020  [name] was observed post Covid-19 immunization for 15 minutes without incident. He was provided with Vaccine Information Sheet and instruction to access the V-Safe system.   [name] was instructed to call 911 with any severe reactions post vaccine: Difficulty breathing  Swelling of face and throat  A fast heartbeat  A bad rash all over body  Dizziness and weakness   Immunizations Administered     Name Date Dose VIS Date Route   Pfizer Covid-19 Vaccine Bivalent Booster 12/28/2020 12:02 PM 0.3 mL 09/30/2020 Intramuscular   Manufacturer:    Lot: [id number]   [place]: [phone]

## 2021-01-07 ENCOUNTER — Other Ambulatory Visit (HOSPITAL_BASED_OUTPATIENT_CLINIC_OR_DEPARTMENT_OTHER): Payer: Self-pay

## 2021-01-07 MED ORDER — FLUAD QUADRIVALENT 0.5 ML IM PRSY
PREFILLED_SYRINGE | INTRAMUSCULAR | 0 refills | Status: DC
  Filled 2021-01-07: qty 0.5, 1d supply, fill #0

## 2021-02-09 ENCOUNTER — Ambulatory Visit (AMBULATORY_SURGERY_CENTER): Payer: 59 | Admitting: *Deleted

## 2021-02-09 ENCOUNTER — Other Ambulatory Visit: Payer: Self-pay

## 2021-02-09 VITALS — Ht 72.0 in | Wt 232.0 lb

## 2021-02-09 DIAGNOSIS — Z86010 Personal history of colonic polyps: Secondary | ICD-10-CM

## 2021-02-09 DIAGNOSIS — Z800 Family history of malignant neoplasm of digestive organs: Secondary | ICD-10-CM

## 2021-02-09 MED ORDER — NA SULFATE-K SULFATE-MG SULF 17.5-3.13-1.6 GM/177ML PO SOLN: 1.0000 | ORAL | 0 refills | Status: DC

## 2021-02-09 NOTE — Progress Notes (Signed)
Patient's pre-visit was done today over the phone with the patient. Name,DOB and address verified. Patient denies any allergies to Eggs and Soy. Patient denies any problems with anesthesia/sedation. Patient is not taking any diet pills or blood thinners. No home Oxygen.  Prep instructions sent to pt's MyChart-pt aware. Patient understands to call us back with any questions or concerns. Patient is aware of our care-partner policy and [id number]-[id number] safety protocol. Used suprep due to adequate prep using miralax last colon.   The patient is COVID-19 vaccinated.

## 2021-02-11 ENCOUNTER — Encounter: Payer: Self-pay | Admitting: Internal Medicine

## 2021-02-11 ENCOUNTER — Ambulatory Visit (INDEPENDENT_AMBULATORY_CARE_PROVIDER_SITE_OTHER): Payer: 59 | Admitting: Internal Medicine

## 2021-02-11 ENCOUNTER — Other Ambulatory Visit: Payer: Self-pay

## 2021-02-11 VITALS — BP 146/96 | HR 66 | Temp 97.9°F | Resp 16 | Ht 72.0 in | Wt 237.0 lb

## 2021-02-11 DIAGNOSIS — N5201 Erectile dysfunction due to arterial insufficiency: Secondary | ICD-10-CM

## 2021-02-11 DIAGNOSIS — I10 Essential (primary) hypertension: Secondary | ICD-10-CM

## 2021-02-11 DIAGNOSIS — Z23 Encounter for immunization: Secondary | ICD-10-CM | POA: Diagnosis not present

## 2021-02-11 DIAGNOSIS — R7303 Prediabetes: Secondary | ICD-10-CM | POA: Diagnosis not present

## 2021-02-11 DIAGNOSIS — E785 Hyperlipidemia, unspecified: Secondary | ICD-10-CM | POA: Insufficient documentation

## 2021-02-11 DIAGNOSIS — Z114 Encounter for screening for human immunodeficiency virus [HIV]: Secondary | ICD-10-CM | POA: Insufficient documentation

## 2021-02-11 DIAGNOSIS — Z0001 Encounter for general adult medical examination with abnormal findings: Secondary | ICD-10-CM | POA: Insufficient documentation

## 2021-02-11 LAB — URINALYSIS, ROUTINE W REFLEX MICROSCOPIC
Bilirubin Urine: NEGATIVE
Hgb urine dipstick: NEGATIVE
Ketones, ur: NEGATIVE
Leukocytes,Ua: NEGATIVE
Nitrite: NEGATIVE
RBC / HPF: NONE SEEN (ref 0–?)
Specific Gravity, Urine: 1.015 (ref 1.000–1.030)
Total Protein, Urine: NEGATIVE
Urine Glucose: NEGATIVE
Urobilinogen, UA: 0.2 (ref 0.0–1.0)
pH: 6 (ref 5.0–8.0)

## 2021-02-11 LAB — LIPID PANEL
Cholesterol: 176 mg/dL (ref 0–200)
HDL: 35 mg/dL — ABNORMAL LOW (ref >39.00)
LDL Cholesterol: 114 mg/dL — ABNORMAL HIGH (ref 0–99)
NonHDL: 141
Total CHOL/HDL Ratio: 5
Triglycerides: 134 mg/dL (ref 0.0–149.0)
VLDL: 26.8 mg/dL (ref 0.0–40.0)

## 2021-02-11 LAB — HEPATIC FUNCTION PANEL
ALT: 15 U/L (ref 0–53)
AST: 17 U/L (ref 0–37)
Albumin: 4.2 g/dL (ref 3.5–5.2)
Alkaline Phosphatase: 45 U/L (ref 39–117)
Bilirubin, Direct: 0.1 mg/dL (ref 0.0–0.3)
Total Bilirubin: 0.9 mg/dL (ref 0.2–1.2)
Total Protein: 6.8 g/dL (ref 6.0–8.3)

## 2021-02-11 LAB — BASIC METABOLIC PANEL
BUN: 16 mg/dL (ref 6–23)
CO2: 23 mEq/L (ref 19–32)
Calcium: 9.3 mg/dL (ref 8.4–10.5)
Chloride: 105 mEq/L (ref 96–112)
Creatinine, Ser: 1.34 mg/dL (ref 0.40–1.50)
GFR: 55.55 mL/min — ABNORMAL LOW (ref >60.00)
Glucose, Bld: 110 mg/dL — ABNORMAL HIGH (ref 70–99)
Potassium: 4 mEq/L (ref 3.5–5.1)
Sodium: 137 mEq/L (ref 135–145)

## 2021-02-11 LAB — TSH: TSH: 2.55 u[IU]/mL (ref 0.35–5.50)

## 2021-02-11 LAB — HEMOGLOBIN A1C: Hgb A1c MFr Bld: 6.3 % (ref 4.6–6.5)
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
## 2021-02-11 NOTE — Patient Instructions (Signed)

## 2021-02-12 LAB — TESTOSTERONE TOTAL,FREE,BIO, MALES
Albumin: 4.2 g/dL (ref 3.6–5.1)
Sex Hormone Binding: 44 nmol/L (ref 22–77)
Testosterone, Bioavailable: 91.9 ng/dL — ABNORMAL LOW (ref 110.0–575.0)
Testosterone, Free: 47.7 pg/mL (ref 46.0–224.0)
Testosterone: 452 ng/dL (ref 250–827)

## 2021-02-12 LAB — HIV ANTIBODY (ROUTINE TESTING W REFLEX): HIV 1&2 Ab, 4th Generation: NONREACTIVE

## 2021-02-12 MED ORDER — TADALAFIL 20 MG PO TABS: 10.0000 mg | ORAL_TABLET | ORAL | 3 refills | Status: DC | PRN

## 2021-02-13 ENCOUNTER — Encounter: Payer: Self-pay | Admitting: Internal Medicine

## 2021-02-17 ENCOUNTER — Encounter: Payer: Self-pay | Admitting: Gastroenterology

## 2021-02-23 ENCOUNTER — Encounter: Payer: Self-pay | Admitting: Gastroenterology

## 2021-02-23 ENCOUNTER — Other Ambulatory Visit: Payer: Self-pay

## 2021-02-23 ENCOUNTER — Ambulatory Visit (AMBULATORY_SURGERY_CENTER): Payer: 59 | Admitting: Gastroenterology

## 2021-02-23 VITALS — BP 116/83 | HR 58 | Temp 98.7°F | Resp 15 | Ht 72.0 in | Wt 232.0 lb

## 2021-02-23 DIAGNOSIS — T184XXA Foreign body in colon, initial encounter: Secondary | ICD-10-CM | POA: Diagnosis not present

## 2021-02-23 DIAGNOSIS — Z800 Family history of malignant neoplasm of digestive organs: Secondary | ICD-10-CM | POA: Diagnosis not present

## 2021-02-23 DIAGNOSIS — K635 Polyp of colon: Secondary | ICD-10-CM | POA: Diagnosis not present

## 2021-02-23 DIAGNOSIS — Z86010 Personal history of colonic polyps: Secondary | ICD-10-CM

## 2021-02-23 DIAGNOSIS — K6389 Other specified diseases of intestine: Secondary | ICD-10-CM | POA: Diagnosis not present

## 2021-02-23 MED ORDER — SODIUM CHLORIDE 0.9 % IV SOLN: 500.0000 mL | Freq: Once | INTRAVENOUS | Status: DC

## 2021-02-23 NOTE — Progress Notes (Signed)
Pt awake, report to RN, VVS  °

## 2021-02-23 NOTE — Progress Notes (Signed)
VS-CW  Pt's states no medical or surgical changes since previsit or office visit.  

## 2021-02-23 NOTE — Progress Notes (Signed)
Called to room to assist during endoscopic procedure.  Patient ID and intended procedure confirmed with present staff. Received instructions for my participation in the procedure from the performing physician.  

## 2021-02-23 NOTE — Patient Instructions (Signed)
Handouts given for diverticulosis, hemorrhoids, and High Fiber Diet.  Recommend High Fiber diet.  Use FiberCon 1-2 tablets orally daily.  Await pathology results.  Resume present medications and diet.     YOU HAD AN ENDOSCOPIC PROCEDURE TODAY AT [facility]:   Refer to the procedure report that was given to you for any specific questions about what was found during the examination.  If the procedure report does not answer your questions, please call your gastroenterologist to clarify.  If you requested that your care partner not be given the details of your procedure findings, then the procedure report has been included in a sealed envelope for you to review at your convenience later.  YOU SHOULD EXPECT: Some feelings of bloating in the abdomen. Passage of more gas than usual.  Walking can help get rid of the air that was put into your GI tract during the procedure and reduce the bloating. If you had a lower endoscopy (such as a colonoscopy or flexible sigmoidoscopy) you may notice spotting of blood in your stool or on the toilet paper. If you underwent a bowel prep for your procedure, you may not have a normal bowel movement for a few days.  Please Note:  You might notice some irritation and congestion in your nose or some drainage.  This is from the oxygen used during your procedure.  There is no need for concern and it should clear up in a day or so.  SYMPTOMS TO REPORT IMMEDIATELY:  Following lower endoscopy (colonoscopy or flexible sigmoidoscopy):  Excessive amounts of blood in the stool  Significant tenderness or worsening of abdominal pains  Swelling of the abdomen that is new, acute  Fever of 100F or higher For urgent or emergent issues, a gastroenterologist can be reached at any hour by calling [phone]. Do not use MyChart messaging for urgent concerns.    DIET:  We do recommend a small meal at first, but then you may proceed to your regular diet.  Drink plenty of  fluids but you should avoid alcoholic beverages for 24 hours.  ACTIVITY:  You should plan to take it easy for the rest of today and you should NOT DRIVE or use heavy machinery until tomorrow (because of the sedation medicines used during the test).    FOLLOW UP: Our staff will call the number listed on your records 48-72 hours following your procedure to check on you and address any questions or concerns that you may have regarding the information given to you following your procedure. If we do not reach you, we will leave a message.  We will attempt to reach you two times.  During this call, we will ask if you have developed any symptoms of COVID 19. If you develop any symptoms (ie: fever, flu-like symptoms, shortness of breath, cough etc.) before then, please call [phone].  If you test positive for Covid 19 in the 2 weeks post procedure, please call and report this information to [place].    If any biopsies were taken you will be contacted by phone or by letter within the next 1-3 weeks.  Please call us at [phone] if you have not heard about the biopsies in 3 weeks.    SIGNATURES/CONFIDENTIALITY: You and/or your care partner have signed paperwork which will be entered into your electronic medical record.  These signatures attest to the fact that that the information above on your After Visit Summary has been reviewed and is understood.  Full  responsibility of the confidentiality of this discharge information lies with you and/or your care-partner.

## 2021-02-23 NOTE — Op Note (Signed)
[facility] Patient Name: Maurice Hart Procedure Date: 02/23/2021 10:59 AM MRN: [id number] Endoscopist: Justice Britain , MD Age: 66 Referring MD:  Date of Birth: December 24, 1955 Gender: Male Account #: [id number] Procedure:                Colonoscopy Indications:              Surveillance: Personal history of piecemeal removal                            of large sessile adenoma on last colonoscopy (1                            year ago) Medicines:                Monitored Anesthesia Care Procedure:                Pre-Anesthesia Assessment:                           - Prior to the procedure, a History and Physical                            was performed, and patient medications and                            allergies were reviewed. The patient's tolerance of                            previous anesthesia was also reviewed. The risks                            and benefits of the procedure and the sedation                            options and risks were discussed with the patient.                            All questions were answered, and informed consent                            was obtained. Prior Anticoagulants: The patient has                            taken no previous anticoagulant or antiplatelet                            agents. ASA Grade Assessment: II - A patient with                            mild systemic disease. After reviewing the risks                            and benefits, the patient was deemed in  satisfactory condition to undergo the procedure.                           After obtaining informed consent, the colonoscope                            was passed under direct vision. Throughout the                            procedure, the patient's blood pressure, pulse, and                            oxygen saturations were monitored continuously. The                            Olympus CF-HQ190L (Serial# [id number]) Colonoscope was                             introduced through the anus and advanced to the 5                            cm into the ileum. The colonoscopy was performed                            without difficulty. The patient tolerated the                            procedure. The quality of the bowel preparation was                            good. The terminal ileum, ileocecal valve,                            appendiceal orifice, and rectum were photographed. Scope In: 11:10:28 AM Scope Out: 11:37:29 AM Scope Withdrawal Time: 0 hours 24 minutes 12 seconds  Total Procedure Duration: 0 hours 27 minutes 1 second  Findings:                 The digital rectal exam findings include                            hemorrhoids. Pertinent negatives include no                            palpable rectal lesions.                           The terminal ileum and ileocecal valve appeared                            normal.                           A medium post mucosectomy scar was found in the  ascending colon. There were 2 areas of residual                            polypoid tissue (one that still had a hemoclip in                            place) and another on the other edge of the                            resection scar. Preparations were made for                            resection. NBI imaging and White-light endoscopy                            was done to demarcate and ensure nothing else was                            being missed. Piecemeal resection using a snare and                            avulsion forceps was performed. Resection and                            retrieval were complete.                           Multiple small and large-mouthed diverticula were                            found in the recto-sigmoid colon, sigmoid colon and                            descending colon.                           Normal mucosa was found in the entire colon                             otherwise.                           Non-bleeding non-thrombosed external and internal                            hemorrhoids were found during retroflexion, during                            perianal exam and during digital exam. The                            hemorrhoids were Grade II (internal hemorrhoids                            that prolapse but  reduce spontaneously). Complications:            No immediate complications. Estimated Blood Loss:     Estimated blood loss was minimal. Impression:               - Hemorrhoids found on digital rectal exam.                           - The examined portion of the ileum was normal.                           - Post mucosectomy scar in the ascending colon with                            2 areas of polypoid tissue present as well as a                            hemoclip with tissue still on it. This area was                            removed as noted above.                           - Diverticulosis in the recto-sigmoid colon, in the                            sigmoid colon and in the descending colon.                           - Normal mucosa in the entire examined colon                            otherwise.                           - Non-bleeding non-thrombosed external and internal                            hemorrhoids. Recommendation:           - The patient will be observed post-procedure,                            until all discharge criteria are met.                           - Discharge patient to home.                           - Patient has a contact number available for                            emergencies. The signs and symptoms of potential                            delayed complications were discussed with the  patient. Return to normal activities tomorrow.                            Written discharge instructions were provided to the                            patient.                            - High fiber diet.                           - Use FiberCon 1-2 tablets PO daily.                           - Continue present medications.                           - Await pathology results.                           - Repeat colonoscopy in 2 years for surveillance                            based on pathology results and findings of                            adenomatous tissue. If no adenomatous tissue then                            3-years is reasonable.                           - The findings and recommendations were discussed                            with the patient.                           - The findings and recommendations were discussed                            with the designated responsible adult. Justice Britain, MD 02/23/2021 11:45:54 AM Maurice Lima, MD
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
## 2021-02-25 ENCOUNTER — Telehealth: Payer: Self-pay

## 2021-02-25 NOTE — Telephone Encounter (Signed)
°  Follow up Call-  Call back number 02/23/2021 01/15/2020  Post procedure Call Back phone  # [phone]  Permission to leave phone message Yes Yes  Some recent data might be hidden     Patient questions:  Do you have a fever, pain , or abdominal swelling? Yes.   Pain Score  2*  Have you tolerated food without any problems? Yes.    Have you been able to return to your normal activities? Yes.    Do you have any questions about your discharge instructions: Diet   No. Medications  No. Follow up visit  No.  Do you have questions or concerns about your Care? No.  Pt. Reports LLQ aching post procedure.  No fever, bleeding, or swelling noted.  Pt. Reports the discomfort/aching has not increased with the passing of time since the procedure, and thinks it is diminishing.  Told pt. To call, if he has any bleeding, swelling, fever, or should the discomfort increase.  Actions: * If pain score is 4 or above: No action needed, pain <4.

## 2021-03-01 ENCOUNTER — Encounter: Payer: Self-pay | Admitting: Gastroenterology

## 2021-05-06 ENCOUNTER — Encounter: Payer: Self-pay | Admitting: Family Medicine

## 2021-05-06 ENCOUNTER — Ambulatory Visit (INDEPENDENT_AMBULATORY_CARE_PROVIDER_SITE_OTHER): Payer: 59 | Admitting: Family Medicine

## 2021-05-06 VITALS — BP 136/86 | HR 73 | Temp 98.0°F | Ht 72.0 in | Wt 241.0 lb

## 2021-05-06 DIAGNOSIS — H1012 Acute atopic conjunctivitis, left eye: Secondary | ICD-10-CM

## 2021-05-06 MED ORDER — AZELASTINE HCL 0.05 % OP SOLN: 1.0000 [drp] | Freq: Two times a day (BID) | OPHTHALMIC | 2 refills | Status: AC
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
## 2021-05-06 NOTE — Patient Instructions (Signed)
Allergic Conjunctivitis, Adult ?Allergic conjunctivitis is inflammation of the clear membrane (conjunctiva) that covers the white part of your eye and the inner surface of your eyelid. ?This condition can make your eye red or pink. It can also make your eye feel itchy. This condition cannot be spread from one person to another person (is not contagious). ?What are the causes? ?This condition is caused by allergens. These are things that can cause an allergic reaction in some people but not in others. Common allergens include: ?Outdoor allergens, such as: ?Pollen, including pollen from grass and weeds. ?Mold. ?Car fumes. ?Indoor allergens, such as: ?Dust. ?Smoke. ?Mold. ?Proteins in a pet's pee (urine), saliva, or dander. ?What increases the risk? ?You are more likely to develop this condition if you have a family history of these things: ?Allergies. ?Conditions that you get because of allergens, such as asthma or inflammation of the skin (eczema). ?What are the signs or symptoms? ?Symptoms of this condition include eyes that are: ?Itchy. ?Red. ?Watery. ?Puffy. ?Your eyes may also: ?Sting or burn. ?Have clear fluid draining from them. ?Have thick mucus coming from them. ?How is this treated? ?This condition may be treated with: ?Cold, wet cloths (cold compresses) to soothe itching and swelling. ?Washing the face to remove allergens. ?Eye drops. These may include: ?Eye drops that block allergies. ?Eye drops that reduce swelling and irritation. ?Steroid eye drops if other treatments have not worked. ?Oral antihistamine medicines. These medicines lessen your allergies. You may need these if eye drops do not help or are difficult to use. ?Follow these instructions at home: ?Eye care ?Place a cool, clean washcloth on your eye for 10-20 minutes. Do this 3-4 times a day. ?Do not touch or rub your eyes. ?Do not wear contact lenses until the inflammation is gone. Wear glasses instead. ?Do not wear eye makeup until the  inflammation is gone. ?General instructions ?Try not to be around things that you are allergic to. ?Take or apply over-the-counter and prescription medicines only as told by your doctor. These include any eye drops. ?Drink enough fluid to keep your pee pale yellow. ?Keep all follow-up visits as told by your doctor. This is important. ?Contact a doctor if: ?Your symptoms get worse. ?Your symptoms do not get better with treatment. ?You have mild eye pain. ?You are sensitive to light. ?You have spots or blisters on your eyes. ?You have pus coming from your eye. ?You have a fever. ?Get help right away if: ?You have redness, swelling, or other symptoms in only one eye. ?You cannot see well. ?You have other vision changes. ?You have very bad eye pain. ?Summary ?Allergic conjunctivitis is caused by allergens. It can make your eye red or pink, and it can make your eye feel itchy. ?This condition cannot be spread from one person to another person (is not contagious). ?Try not to be around things that you are allergic to. ?Take or apply over-the-counter and prescription medicines only as told by your doctor. These include any eye drops. ?Contact your doctor if your symptoms get worse or they do not get better with treatment. ?This information is not intended to replace advice given to you by your health care provider. Make sure you discuss any questions you have with your health care provider. ?Document Revised: 12/10/2018 Document Reviewed: 12/10/2018 ?Elsevier Patient Education ? [place]. ? ?

## 2021-06-22 ENCOUNTER — Emergency Department (HOSPITAL_COMMUNITY): Payer: 59

## 2021-06-22 ENCOUNTER — Encounter (HOSPITAL_COMMUNITY): Payer: Self-pay | Admitting: Pharmacy Technician

## 2021-06-22 ENCOUNTER — Emergency Department (HOSPITAL_COMMUNITY)
Admission: EM | Admit: 2021-06-22 | Discharge: 2021-06-22 | Disposition: A | Discharge status: Home / Self Care | Payer: 59 | Attending: Emergency Medicine | Admitting: Emergency Medicine

## 2021-06-22 ENCOUNTER — Other Ambulatory Visit: Payer: Self-pay

## 2021-06-22 DIAGNOSIS — R0989 Other specified symptoms and signs involving the circulatory and respiratory systems: Secondary | ICD-10-CM | POA: Insufficient documentation

## 2021-06-22 NOTE — Discharge Instructions (Signed)
Your symptoms should continue to improve.  Please return for worsening difficulty breathing.  Follow-up with your doctor in the office.

## 2021-06-22 NOTE — ED Provider Notes (Signed)
[facility] DEPT Provider Note   CSN: [id number] Arrival date & time: 06/22/21  1042     History  Chief Complaint  Patient presents with   Choking    Maurice Hart is a 67 y.o. male.  66 yo M with a chief complaints of an episode this morning where he got choked on some water.  Tells me that he was drink some water and felt like it went down the wrong pipe and had severe difficulty breathing and coughing.  This lasted for at least 50 minutes.  Since then its gotten progressively better.  Last episode was about 10 minutes prior to arrival to the ED.  Feels much better currently.  No illness preceding.  No other no difficulty swallowing.  Has been able to swallow afterwards without issue.       Home Medications Prior to Admission medications   Medication Sig Start Date End Date Taking? Authorizing Provider  azelastine (OPTIVAR) 0.05 % ophthalmic solution Place 1 drop into the left eye 2 (two) times daily. 05/06/21   Henson, Vickie L, NP-C  CALCIUM PO Take by mouth daily.    [provider]  Multiple Vitamin (MULTIVITAMIN) tablet Take 1 tablet by mouth daily.    [provider]  tadalafil (CIALIS) 20 MG tablet Take 0.5-1 tablets (10-20 mg total) by mouth every other day as needed for erectile dysfunction. 02/12/21   Janith Lima, MD      Allergies    Erythromycin    Review of Systems   Review of Systems  Physical Exam Updated Vital Signs BP 135/86   Pulse 71   Temp 98 F (36.7 C) (Oral)   Resp 18   SpO2 100%  Physical Exam Vitals and nursing note reviewed.  Constitutional:      Appearance: He is well-developed.  HENT:     Head: Normocephalic and atraumatic.     Mouth/Throat:     Comments: Tolerating secretions without difficulty.  Symmetric palate elevation. Eyes:     Pupils: Pupils are equal, round, and reactive to light.  Neck:     Vascular: No JVD.  Cardiovascular:     Rate and Rhythm: Normal rate and regular  rhythm.     Heart sounds: No murmur heard.   No friction rub. No gallop.  Pulmonary:     Effort: No respiratory distress.     Breath sounds: No wheezing.  Abdominal:     General: There is no distension.     Tenderness: There is no abdominal tenderness. There is no guarding or rebound.  Musculoskeletal:        General: Normal range of motion.     Cervical back: Normal range of motion and neck supple.  Skin:    Coloration: Skin is not pale.     Findings: No rash.  Neurological:     Mental Status: He is alert and oriented to person, place, and time.  Psychiatric:        Behavior: Behavior normal.    ED Results / Procedures / Treatments   Labs (all labs ordered are listed, but only abnormal results are displayed) Labs Reviewed - No data to display  EKG None  Radiology DG Chest 2 View  Result Date: 06/22/2021 CLINICAL DATA:  Difficulty breathing, choked on water EXAM: CHEST - 2 VIEW COMPARISON:  10/27/2009 FINDINGS: Cardiac size is within normal limits. There are no signs of pulmonary edema. There is no focal pulmonary consolidation in the PA view.  There is subtle increased density in the posterior lower lung fields in the lateral projection. There is faint transverse linear density in the medial right lower lung fields. There is no pleural effusion or pneumothorax. IMPRESSION: There are no signs of pulmonary edema. There is no focal pulmonary consolidation. There is no pleural effusion or pneumothorax. There is transverse linear density in the medial right lower lung fields which may suggest pleural thickening or subsegmental atelectasis. Subtle increased density seen in the posterior lower lung fields seen only in the lateral projection may be due to crowding of normal bronchovascular structures. If there are continued symptoms short-term follow-up chest radiographs in few hours may be considered. Electronically Signed   By: Elmer Picker M.D.   On: 06/22/2021 11:50     Procedures Procedures    Medications Ordered in ED Medications - No data to display  ED Course/ Medical Decision Making/ A&P                           Medical Decision Making Amount and/or Complexity of Data Reviewed Radiology: ordered.   66 yo M with a chief complaints of an aspiration event.  It sounds like the patient aspirated a swallow of water.  Had a significant episode of difficulty breathing but has now gotten quite better.  Has clear lungs on my exam.  Chest x-ray independently interpreted by me without focal infiltrate or pneumothorax.  Radiology read with possible transverse opacity in the medial right lower lung field.  As the patient is continue to improve clinically on repeat assessment do not feel he needs further observation in the ER or repeat imaging at this visit.  We will have him follow-up with his family doctor in the office.  I did discuss the possibility of delayed aspiration pneumonia or pulmonary edema and encouraged him to return for worsening difficulty breathing.  1:13 PM:  I have discussed the diagnosis/risks/treatment options with the patient and family.  Evaluation and diagnostic testing in the emergency department does not suggest an emergent condition requiring admission or immediate intervention beyond what has been performed at this time.  They will follow up with  PCP. We also discussed returning to the ED immediately if new or worsening sx occur. We discussed the sx which are most concerning (e.g., sudden worsening pain, fever, inability to tolerate by mouth) that necessitate immediate return. Medications administered to the patient during their visit and any new prescriptions provided to the patient are listed below.  Medications given during this visit Medications - No data to display   The patient appears reasonably screen and/or stabilized for discharge and I doubt any other medical condition or other [facility] requiring further screening, evaluation,  or treatment in the ED at this time prior to discharge.          Final Clinical Impression(s) / ED Diagnoses Final diagnoses:  Choking episode    Rx / DC Orders ED Discharge Orders     None         Deno Etienne, DO 06/22/21 1313

## 2021-06-22 NOTE — ED Triage Notes (Signed)
Pt states he choked on water this morning approx 0630. States he felt choked up for approx 1 hour. Pt reports feeling his throat spasm after that. Pt states he has had periods of feeling like he was not getting enough oxygen.

## 2021-06-23 ENCOUNTER — Encounter: Payer: Self-pay | Admitting: Internal Medicine

## 2021-06-23 ENCOUNTER — Ambulatory Visit (INDEPENDENT_AMBULATORY_CARE_PROVIDER_SITE_OTHER): Payer: 59 | Admitting: Internal Medicine

## 2021-06-23 ENCOUNTER — Ambulatory Visit (INDEPENDENT_AMBULATORY_CARE_PROVIDER_SITE_OTHER): Payer: 59

## 2021-06-23 VITALS — BP 126/76 | HR 73 | Temp 98.2°F | Ht 72.0 in | Wt 232.0 lb

## 2021-06-23 DIAGNOSIS — R292 Abnormal reflex: Secondary | ICD-10-CM | POA: Diagnosis not present

## 2021-06-23 DIAGNOSIS — J301 Allergic rhinitis due to pollen: Secondary | ICD-10-CM | POA: Diagnosis not present

## 2021-06-23 DIAGNOSIS — R052 Subacute cough: Secondary | ICD-10-CM | POA: Insufficient documentation

## 2021-06-23 DIAGNOSIS — J392 Other diseases of pharynx: Secondary | ICD-10-CM | POA: Insufficient documentation

## 2021-06-23 MED ORDER — METHYLPREDNISOLONE 4 MG PO TBPK: ORAL_TABLET | ORAL | 0 refills | Status: AC

## 2021-06-23 NOTE — Progress Notes (Signed)
Subjective:  Patient ID: Maurice Hart, male    DOB: 1955-11-05  Age: 66 y.o. MRN: [id number]  CC: Sinusitis and Allergic Rhinitis    HPI SYON TEWS presents for f/up -  His sx's started about 1 week ago when he ventured into his attic and he develop nasal congestion then yesterday he developed a dry mouth and "lost" his gag reflex and developed a NP cough.  Outpatient Medications Prior to Visit  Medication Sig Dispense Refill   azelastine (OPTIVAR) 0.05 % ophthalmic solution Place 1 drop into the left eye 2 (two) times daily. 6 mL 2   CALCIUM PO Take by mouth daily.     Multiple Vitamin (MULTIVITAMIN) tablet Take 1 tablet by mouth daily.     tadalafil (CIALIS) 20 MG tablet Take 0.5-1 tablets (10-20 mg total) by mouth every other day as needed for erectile dysfunction. 10 tablet 3   No facility-administered medications prior to visit.    ROS Review of Systems  Constitutional:  Negative for appetite change, chills, diaphoresis, fatigue and fever.  HENT:  Positive for congestion, postnasal drip, rhinorrhea, trouble swallowing and voice change. Negative for facial swelling, sinus pressure and sore throat.   Respiratory:  Positive for cough. Negative for chest tightness, shortness of breath and wheezing.   Cardiovascular:  Negative for chest pain, palpitations and leg swelling.  Gastrointestinal:  Negative for abdominal pain.  Genitourinary: Negative.  Negative for difficulty urinating.  Musculoskeletal: Negative.   Skin: Negative.   Neurological: Negative.  Negative for dizziness, weakness, light-headedness and headaches.  Hematological:  Negative for adenopathy. Does not bruise/bleed easily.  Psychiatric/Behavioral: Negative.     Objective:  BP 126/76 (BP Location: Left Arm, Patient Position: Sitting, Cuff Size: Large)   Pulse 73   Temp 98.2 F (36.8 C) (Oral)   Ht 6' (1.829 m)   Wt 232 lb (105.2 kg)   SpO2 98%   BMI 31.46 kg/m   BP Readings from Last 3 Encounters:   06/23/21 126/76  06/22/21 135/86  05/06/21 136/86    Wt Readings from Last 3 Encounters:  06/23/21 232 lb (105.2 kg)  05/06/21 241 lb (109.3 kg)  02/23/21 232 lb (105.2 kg)    Physical Exam Constitutional:      Appearance: He is not ill-appearing.  HENT:     Nose: Congestion present. No rhinorrhea.     Mouth/Throat:     Mouth: Mucous membranes are moist.     Pharynx: Oropharynx is clear. Uvula midline. No pharyngeal swelling, oropharyngeal exudate, posterior oropharyngeal erythema or uvula swelling.     Tonsils: No tonsillar exudate or tonsillar abscesses.  Eyes:     General: No scleral icterus.    Conjunctiva/sclera: Conjunctivae normal.  Cardiovascular:     Rate and Rhythm: Normal rate and regular rhythm.     Pulses: Normal pulses.     Heart sounds: No murmur heard.   No gallop.  Pulmonary:     Effort: Pulmonary effort is normal.     Breath sounds: No stridor. No wheezing, rhonchi or rales.  Abdominal:     Tenderness: There is no abdominal tenderness. There is no guarding or rebound.     Hernia: No hernia is present.  Musculoskeletal:        General: Normal range of motion.     Cervical back: Neck supple.     Right lower leg: No edema.     Left lower leg: No edema.  Lymphadenopathy:     Cervical: No  cervical adenopathy.  Skin:    General: Skin is warm and dry.     Coloration: Skin is not jaundiced or pale.     Findings: No bruising or lesion.  Neurological:     General: No focal deficit present.     Mental Status: He is alert and oriented to person, place, and time. Mental status is at baseline.     Cranial Nerves: Cranial nerves 2-12 are intact. No cranial nerve deficit.     Sensory: Sensation is intact. No sensory deficit.     Motor: Motor function is intact. No weakness.     Coordination: Coordination normal.     Gait: Gait normal.     Deep Tendon Reflexes: Reflexes normal. Babinski sign absent on the right side. Babinski sign absent on the left side.      Reflex Scores:      Tricep reflexes are 1+ on the right side and 1+ on the left side.      Bicep reflexes are 1+ on the right side and 1+ on the left side.      Brachioradialis reflexes are 1+ on the right side and 1+ on the left side.      Patellar reflexes are 2+ on the right side and 2+ on the left side.      Achilles reflexes are 1+ on the right side and 1+ on the left side.   Lab Results  Component Value Date   WBC 3.9 07/10/2020   HGB 15.3 07/10/2020   HCT 45.6 07/10/2020   PLT 187 07/10/2020   GLUCOSE 110 (H) 02/11/2021   CHOL 176 02/11/2021   TRIG 134.0 02/11/2021   HDL 35.00 (L) 02/11/2021   LDLCALC 114 (H) 02/11/2021   ALT 15 02/11/2021   AST 17 02/11/2021   NA 137 02/11/2021   K 4.0 02/11/2021   CL 105 02/11/2021   CREATININE 1.34 02/11/2021   BUN 16 02/11/2021   CO2 23 02/11/2021   TSH 2.55 02/11/2021   PSA 2.98 07/10/2020   HGBA1C 6.3 02/11/2021    DG Chest 2 View  Result Date: 06/22/2021 CLINICAL DATA:  Difficulty breathing, choked on water EXAM: CHEST - 2 VIEW COMPARISON:  10/27/2009 FINDINGS: Cardiac size is within normal limits. There are no signs of pulmonary edema. There is no focal pulmonary consolidation in the PA view. There is subtle increased density in the posterior lower lung fields in the lateral projection. There is faint transverse linear density in the medial right lower lung fields. There is no pleural effusion or pneumothorax. IMPRESSION: There are no signs of pulmonary edema. There is no focal pulmonary consolidation. There is no pleural effusion or pneumothorax. There is transverse linear density in the medial right lower lung fields which may suggest pleural thickening or subsegmental atelectasis. Subtle increased density seen in the posterior lower lung fields seen only in the lateral projection may be due to crowding of normal bronchovascular structures. If there are continued symptoms short-term follow-up chest radiographs in few hours may be  considered. Electronically Signed   By: Elmer Picker M.D.   On: 06/22/2021 11:50   DG Neck Soft Tissue  Result Date: 06/23/2021 CLINICAL DATA:  Throat swelling. Dryness and cough after aspirating water. EXAM: NECK SOFT TISSUES - 1+ VIEW COMPARISON:  None FINDINGS: There is no evidence of retropharyngeal soft tissue swelling or epiglottic enlargement. The cervical airway is unremarkable and no radio-opaque foreign body identified. IMPRESSION: Normal soft tissue radiographs. Electronically Signed   By: Elta Guadeloupe  Shogry M.D.   On: 06/23/2021 12:04   DG Chest 2 View  Result Date: 06/23/2021 CLINICAL DATA:  Cough EXAM: CHEST - 2 VIEW COMPARISON:  06/22/2021 FINDINGS: The heart size and mediastinal contours are within normal limits. Both lungs are clear. The visualized skeletal structures are unremarkable. IMPRESSION: No active cardiopulmonary disease. Electronically Signed   By: Franchot Gallo M.D.   On: 06/23/2021 12:04     Assessment & Plan:   Jawanza was seen today for sinusitis and allergic rhinitis .  Diagnoses and all orders for this visit:  Subacute cough- CXR is normal. -     DG Chest 2 View; Future  Abnormal gag reflex- Plain films are normal. Will evaluate for a brainstem CVA. -     DG Neck Soft Tissue; Future -     MR Brain Wo Contrast; Future  Swelling of throat -     DG Neck Soft Tissue; Future -     methylPREDNISolone (MEDROL DOSEPAK) 4 MG TBPK tablet; TAKE AS DIRECTED  Seasonal allergic rhinitis due to pollen- Will treat for an allergic reaction. -     methylPREDNISolone (MEDROL DOSEPAK) 4 MG TBPK tablet; TAKE AS DIRECTED   I am having Kanen A. Meinhardt start on methylPREDNISolone. I am also having him maintain his multivitamin, CALCIUM PO, tadalafil, and azelastine.  Meds ordered this encounter  Medications   methylPREDNISolone (MEDROL DOSEPAK) 4 MG TBPK tablet    Sig: TAKE AS DIRECTED    Dispense:  21 tablet    Refill:  0     Follow-up: Return in about 3 months  (around 09/23/2021).  Scarlette Calico, MD

## 2021-06-23 NOTE — Patient Instructions (Signed)
Allergic Rhinitis, Adult  Allergic rhinitis is an allergic reaction that affects the mucous membrane inside the nose. The mucous membrane is the tissue that produces mucus. There are two types of allergic rhinitis: Seasonal. This type is also called hay fever and happens only during certain seasons. Perennial. This type can happen at any time of the year. Allergic rhinitis cannot be spread from person to person. This condition can be mild, moderate, or severe. It can develop at any age and may be outgrown. What are the causes? This condition is caused by allergens. These are things that can cause an allergic reaction. Allergens may differ for seasonal allergic rhinitis and perennial allergic rhinitis. Seasonal allergic rhinitis is triggered by pollen. Pollen can come from grasses, trees, and weeds. Perennial allergic rhinitis may be triggered by: Dust mites. Proteins in a pet's urine, saliva, or dander. Dander is dead skin cells from a pet. Smoke, mold, or car fumes. What increases the risk? You are more likely to develop this condition if you have a family history of allergies or other conditions related to allergies, including: Allergic conjunctivitis. This is inflammation of parts of the eyes and eyelids. Asthma. This condition affects the lungs and makes it hard to breathe. Atopic dermatitis or eczema. This is long term (chronic) inflammation of the skin. Food allergies. What are the signs or symptoms? Symptoms of this condition include: Sneezing or coughing. A stuffy nose (nasal congestion), itchy nose, or nasal discharge. Itchy eyes and tearing of the eyes. A feeling of mucus dripping down the back of your throat (postnasal drip). Trouble sleeping. Tiredness or fatigue. Headache. Sore throat. How is this diagnosed? This condition may be diagnosed with your symptoms, medical history, and physical exam. Your health care provider may check for related conditions, such  as: Asthma. Pink eye. This is eye inflammation caused by infection (conjunctivitis). Ear infection. Upper respiratory infection. This is an infection in the nose, throat, or upper airways. You may also have tests to find out which allergens trigger your symptoms. These may include skin tests or blood tests. How is this treated? There is no cure for this condition, but treatment can help control symptoms. Treatment may include: Taking medicines that block allergy symptoms, such as corticosteroids and antihistamines. Medicine may be given as a shot, nasal spray, or pill. Avoiding any allergens. Being exposed again and again to tiny amounts of allergens to help you build a defense against allergens (immunotherapy). This is done if other treatments have not helped. It may include: Allergy shots. These are injected medicines that have small amounts of allergen in them. Sublingual immunotherapy. This involves taking small doses of a medicine with allergen in it under your tongue. If these treatments do not work, your health care provider may prescribe newer, stronger medicines. Follow these instructions at home: Avoiding allergens Find out what you are allergic to and avoid those allergens. These are some things you can do to help avoid allergens: If you have perennial allergies: Replace carpet with wood, tile, or vinyl flooring. Carpet can trap dander and dust. Do not smoke. Do not allow smoking in your home. Change your heating and air conditioning filters at least once a month. If you have seasonal allergies, take these steps during allergy season: Keep windows closed as much as possible. Plan outdoor activities when pollen counts are lowest. Check pollen counts before you plan outdoor activities. When coming indoors, change clothing and shower before sitting on furniture or bedding. If you have a pet   in the house that produces allergens: Keep the pet out of the bedroom. Vacuum, sweep, and  dust regularly. General instructions Take over-the-counter and prescription medicines only as told by your health care provider. Drink enough fluid to keep your urine pale yellow. Keep all follow-up visits as told by your health care provider. This is important. Where to find more information American Academy of Allergy, Asthma & Immunology: www.aaaai.org Contact a health care provider if: You have a fever. You develop a cough that does not go away. You make whistling sounds when you breathe (wheeze). Your symptoms slow you down or stop you from doing your normal activities each day. Get help right away if: You have shortness of breath. This symptom may represent a serious problem that is an emergency. Do not wait to see if the symptom will go away. Get medical help right away. Call your local emergency services (911 in the [place] Do not drive yourself to the hospital. Summary Allergic rhinitis may be managed by taking medicines as directed and avoiding allergens. If you have seasonal allergies, keep windows closed as much as possible during allergy season. Contact your health care provider if you develop a fever or a cough that does not go away. This information is not intended to replace advice given to you by your health care provider. Make sure you discuss any questions you have with your health care provider. Document Revised: 03/08/2019 Document Reviewed: 01/15/2019 Elsevier Patient Education  2023 Elsevier Inc.  

## 2021-06-24 ENCOUNTER — Encounter: Payer: Self-pay | Admitting: Internal Medicine

## 2021-06-24 ENCOUNTER — Ambulatory Visit
Admission: RE | Admit: 2021-06-24 | Discharge: 2021-06-24 | Disposition: A | Discharge status: Home / Self Care | Payer: 59 | Source: Ambulatory Visit | Attending: Internal Medicine | Admitting: Internal Medicine

## 2021-06-24 DIAGNOSIS — R292 Abnormal reflex: Secondary | ICD-10-CM

## 2021-08-04 ENCOUNTER — Ambulatory Visit (INDEPENDENT_AMBULATORY_CARE_PROVIDER_SITE_OTHER): Payer: 59 | Admitting: Family Medicine

## 2021-08-04 ENCOUNTER — Encounter: Payer: Self-pay | Admitting: Family Medicine

## 2021-08-04 VITALS — BP 132/78 | HR 82 | Temp 97.8°F | Ht 72.0 in | Wt 237.0 lb

## 2021-08-04 DIAGNOSIS — H938X2 Other specified disorders of left ear: Secondary | ICD-10-CM | POA: Diagnosis not present

## 2021-08-04 DIAGNOSIS — R0981 Nasal congestion: Secondary | ICD-10-CM | POA: Diagnosis not present

## 2021-08-04 DIAGNOSIS — R42 Dizziness and giddiness: Secondary | ICD-10-CM

## 2021-08-04 DIAGNOSIS — H65192 Other acute nonsuppurative otitis media, left ear: Secondary | ICD-10-CM

## 2021-08-04 DIAGNOSIS — J309 Allergic rhinitis, unspecified: Secondary | ICD-10-CM

## 2021-08-04 LAB — COMPREHENSIVE METABOLIC PANEL
ALT: 15 U/L (ref 0–53)
AST: 17 U/L (ref 0–37)
Albumin: 4.1 g/dL (ref 3.5–5.2)
Alkaline Phosphatase: 43 U/L (ref 39–117)
BUN: 20 mg/dL (ref 6–23)
CO2: 27 mEq/L (ref 19–32)
Calcium: 9.4 mg/dL (ref 8.4–10.5)
Chloride: 108 mEq/L (ref 96–112)
Creatinine, Ser: 1.15 mg/dL (ref 0.40–1.50)
GFR: 66.51 mL/min (ref >60.00)
Glucose, Bld: 120 mg/dL — ABNORMAL HIGH (ref 70–99)
Potassium: 4 mEq/L (ref 3.5–5.1)
Sodium: 140 mEq/L (ref 135–145)
Total Bilirubin: 0.5 mg/dL (ref 0.2–1.2)
Total Protein: 6.4 g/dL (ref 6.0–8.3)

## 2021-08-04 LAB — CBC WITH DIFFERENTIAL/PLATELET
Basophils Absolute: 0 10*3/uL (ref 0.0–0.1)
Basophils Relative: 0.6 % (ref 0.0–3.0)
Eosinophils Absolute: 0 10*3/uL (ref 0.0–0.7)
Eosinophils Relative: 0.8 % (ref 0.0–5.0)
HCT: 43.5 % (ref 39.0–52.0)
Hemoglobin: 14.5 g/dL (ref 13.0–17.0)
Lymphocytes Relative: 18.2 % (ref 12.0–46.0)
Lymphs Abs: 1.1 10*3/uL (ref 0.7–4.0)
MCHC: 33.4 g/dL (ref 30.0–36.0)
MCV: 87.7 fl (ref 78.0–100.0)
Monocytes Absolute: 0.4 10*3/uL (ref 0.1–1.0)
Monocytes Relative: 6.8 % (ref 3.0–12.0)
Neutro Abs: 4.4 10*3/uL (ref 1.4–7.7)
Neutrophils Relative %: 73.6 % (ref 43.0–77.0)
Platelets: 188 10*3/uL (ref 150.0–400.0)
RBC: 4.96 Mil/uL (ref 4.22–5.81)
RDW: 13.5 % (ref 11.5–15.5)
WBC: 5.9 10*3/uL (ref 4.0–10.5)

## 2021-08-04 MED ORDER — PREDNISONE 20 MG PO TABS: 40.0000 mg | ORAL_TABLET | Freq: Every day | ORAL | 0 refills | Status: DC

## 2021-08-04 MED ORDER — MECLIZINE HCL 25 MG PO TABS: 25.0000 mg | ORAL_TABLET | Freq: Two times a day (BID) | ORAL | 0 refills | Status: DC | PRN

## 2021-08-04 MED ORDER — FLUTICASONE PROPIONATE 50 MCG/ACT NA SUSP: 2.0000 | Freq: Every day | NASAL | 6 refills | Status: DC

## 2021-08-04 NOTE — Patient Instructions (Addendum)
Go to the lab on the first floor before you leave.   Start using Flonase and take oral over the counter Xyzal.   Stay well hydrated.   If you have dizziness, take the meclizine.   Follow up if worsening or if you are not improving in the next few days.   Dizziness Dizziness is a common problem. It is a feeling of unsteadiness or light-headedness. You may feel like you are about to faint. Dizziness can lead to injury if you stumble or fall. Anyone can become dizzy, but dizziness is more common in older adults. This condition can be caused by a number of things, including medicines, dehydration, or illness. Follow these instructions at home: Eating and drinking  Drink enough fluid to keep your urine pale yellow. This helps to keep you from becoming dehydrated. Try to drink more clear fluids, such as water. Do not drink alcohol. Limit your caffeine intake if told to do so by your health care provider. Check ingredients and nutrition facts to see if a food or beverage contains caffeine. Limit your salt (sodium) intake if told to do so by your health care provider. Check ingredients and nutrition facts to see if a food or beverage contains sodium. Activity  Avoid making quick movements. Rise slowly from chairs and steady yourself until you feel okay. In the morning, first sit up on the side of the bed. When you feel okay, stand slowly while you hold onto something until you know that your balance is good. If you need to stand in one place for a long time, move your legs often. Tighten and relax the muscles in your legs while you are standing. Do not drive or use machinery if you feel dizzy. Avoid bending down if you feel dizzy. Place items in your home so that they are easy for you to reach without leaning over. Lifestyle Do not use any products that contain nicotine or tobacco. These products include cigarettes, chewing tobacco, and vaping devices, such as e-cigarettes. If you need help  quitting, ask your health care provider. Try to reduce your stress level by using methods such as yoga or meditation. Talk with your health care provider if you need help to manage your stress. General instructions Watch your dizziness for any changes. Take over-the-counter and prescription medicines only as told by your health care provider. Talk with your health care provider if you think that your dizziness is caused by a medicine that you are taking. Tell a friend or a family member that you are feeling dizzy. If he or she notices any changes in your behavior, have this person call your health care provider. Keep all follow-up visits. This is important. Contact a health care provider if: Your dizziness does not go away or you have new symptoms. Your dizziness or light-headedness gets worse. You feel nauseous. You have reduced hearing. You have a fever. You have neck pain or a stiff neck. Your dizziness leads to an injury or a fall. Get help right away if: You vomit or have diarrhea and are unable to eat or drink anything. You have problems talking, walking, swallowing, or using your arms, hands, or legs. You feel generally weak. You have any bleeding. You are not thinking clearly or you have trouble forming sentences. It may take a friend or family member to notice this. You have chest pain, abdominal pain, shortness of breath, or sweating. Your vision changes or you develop a severe headache. These symptoms may represent a  serious problem that is an emergency. Do not wait to see if the symptoms will go away. Get medical help right away. Call your local emergency services (911 in the [place] Do not drive yourself to the hospital. Summary Dizziness is a feeling of unsteadiness or light-headedness. This condition can be caused by a number of things, including medicines, dehydration, or illness. Anyone can become dizzy, but dizziness is more common in older adults. Drink enough fluid to  keep your urine pale yellow. Do not drink alcohol. Avoid making quick movements if you feel dizzy. Monitor your dizziness for any changes. This information is not intended to replace advice given to you by your health care provider. Make sure you discuss any questions you have with your health care provider. Document Revised: 12/23/2019 Document Reviewed: 12/23/2019 Elsevier Patient Education  [place].
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
## 2021-08-05 DIAGNOSIS — H65192 Other acute nonsuppurative otitis media, left ear: Secondary | ICD-10-CM | POA: Insufficient documentation

## 2021-08-05 DIAGNOSIS — R42 Dizziness and giddiness: Secondary | ICD-10-CM | POA: Insufficient documentation

## 2021-08-05 DIAGNOSIS — H938X2 Other specified disorders of left ear: Secondary | ICD-10-CM | POA: Insufficient documentation

## 2021-08-05 DIAGNOSIS — J309 Allergic rhinitis, unspecified: Secondary | ICD-10-CM | POA: Insufficient documentation

## 2021-08-05 DIAGNOSIS — R0981 Nasal congestion: Secondary | ICD-10-CM | POA: Insufficient documentation

## 2021-08-05 NOTE — Assessment & Plan Note (Signed)
Use Flonase and Xyzal. Avoid triggers

## 2021-08-05 NOTE — Assessment & Plan Note (Signed)
Use Flonase. Oral steroid prescribed. Follow up if not improving.

## 2021-08-05 NOTE — Assessment & Plan Note (Signed)
Possible BBPV. Try meclizine and change positions slowly. Increase hydration. Follow up if not resolving.

## 2021-09-20 ENCOUNTER — Encounter: Payer: Self-pay | Admitting: Internal Medicine

## 2021-09-20 ENCOUNTER — Ambulatory Visit: Payer: 59 | Admitting: Internal Medicine

## 2021-09-20 VITALS — BP 138/84 | HR 71 | Temp 98.4°F | Resp 16 | Ht 72.0 in | Wt 240.0 lb

## 2021-09-20 DIAGNOSIS — R7303 Prediabetes: Secondary | ICD-10-CM

## 2021-09-20 DIAGNOSIS — Z0001 Encounter for general adult medical examination with abnormal findings: Secondary | ICD-10-CM | POA: Diagnosis not present

## 2021-09-20 DIAGNOSIS — I10 Essential (primary) hypertension: Secondary | ICD-10-CM | POA: Diagnosis not present

## 2021-09-20 LAB — HEMOGLOBIN A1C: Hgb A1c MFr Bld: 6.3 % (ref 4.6–6.5)

## 2021-09-20 LAB — PSA: PSA: 1.84 ng/mL (ref 0.10–4.00)

## 2021-09-20 NOTE — Progress Notes (Signed)
Subjective:  Patient ID: Maurice Hart, male    DOB: [birth year]  Age: 66 y.o. MRN: [id number]  CC: Hypertension   HPI Maurice Hart presents for f/up -  He walks a couple miles a day and does not experience chest pain, shortness of breath, diaphoresis, or edema.  Outpatient Medications Prior to Visit  Medication Sig Dispense Refill   azelastine (OPTIVAR) 0.05 % ophthalmic solution Place 1 drop into the left eye 2 (two) times daily. 6 mL 2   CALCIUM PO Take by mouth daily.     fluticasone (FLONASE) 50 MCG/ACT nasal spray Place 2 sprays into both nostrils daily. 16 g 6   meclizine (ANTIVERT) 25 MG tablet Take 1 tablet (25 mg total) by mouth 2 (two) times daily as needed for dizziness. 30 tablet 0   Multiple Vitamin (MULTIVITAMIN) tablet Take 1 tablet by mouth daily.     predniSONE (DELTASONE) 20 MG tablet Take 2 tablets (40 mg total) by mouth daily with breakfast. 10 tablet 0   tadalafil (CIALIS) 20 MG tablet Take 0.5-1 tablets (10-20 mg total) by mouth every other day as needed for erectile dysfunction. 10 tablet 3   No facility-administered medications prior to visit.    ROS Review of Systems  Constitutional:  Negative for chills, diaphoresis and fatigue.  HENT: Negative.    Eyes: Negative.   Respiratory:  Negative for cough, chest tightness, shortness of breath and wheezing.   Cardiovascular:  Negative for chest pain, palpitations and leg swelling.  Gastrointestinal:  Negative for abdominal pain, diarrhea and nausea.  Endocrine: Negative.   Genitourinary: Negative.  Negative for difficulty urinating.  Musculoskeletal: Negative.   Skin: Negative.   Neurological:  Negative for dizziness, weakness and headaches.  Hematological:  Negative for adenopathy. Does not bruise/bleed easily.  Psychiatric/Behavioral: Negative.      Objective:  BP 138/84 (BP Location: Right Arm, Patient Position: Sitting, Cuff Size: Large)   Pulse 71   Temp 98.4 F (36.9 C) (Oral)   Resp 16   Ht 6'  (1.829 m)   Wt 240 lb (108.9 kg)   SpO2 97%   BMI 32.55 kg/m   BP Readings from Last 3 Encounters:  09/20/21 138/84  08/04/21 132/78  06/23/21 126/76    Wt Readings from Last 3 Encounters:  09/20/21 240 lb (108.9 kg)  08/04/21 237 lb (107.5 kg)  06/23/21 232 lb (105.2 kg)    Physical Exam Vitals reviewed.  HENT:     Nose: Nose normal.     Mouth/Throat:     Mouth: Mucous membranes are moist.  Eyes:     General: No scleral icterus.    Conjunctiva/sclera: Conjunctivae normal.  Cardiovascular:     Rate and Rhythm: Normal rate and regular rhythm.     Heart sounds: No murmur heard. Pulmonary:     Effort: Pulmonary effort is normal.     Breath sounds: No stridor. No wheezing, rhonchi or rales.  Abdominal:     General: Abdomen is protuberant.     Palpations: There is no mass.     Tenderness: There is no abdominal tenderness. There is no guarding.     Hernia: No hernia is present.  Musculoskeletal:        General: Normal range of motion.     Cervical back: Neck supple.     Right lower leg: No edema.     Left lower leg: No edema.  Lymphadenopathy:     Cervical: No cervical adenopathy.  Skin:  General: Skin is warm and dry.  Neurological:     General: No focal deficit present.     Mental Status: He is alert.  Psychiatric:        Mood and Affect: Mood normal.        Behavior: Behavior normal.     Lab Results  Component Value Date   WBC 5.9 08/04/2021   HGB 14.5 08/04/2021   HCT 43.5 08/04/2021   PLT 188.0 08/04/2021   GLUCOSE 120 (H) 08/04/2021   CHOL 176 02/11/2021   TRIG 134.0 02/11/2021   HDL 35.00 (L) 02/11/2021   LDLCALC 114 (H) 02/11/2021   ALT 15 08/04/2021   AST 17 08/04/2021   NA 140 08/04/2021   K 4.0 08/04/2021   CL 108 08/04/2021   CREATININE 1.15 08/04/2021   BUN 20 08/04/2021   CO2 27 08/04/2021   TSH 2.55 02/11/2021   PSA 1.84 09/20/2021   HGBA1C 6.3 09/20/2021    MR Brain Wo Contrast  Result Date: 06/24/2021 CLINICAL DATA:  Acute  neuro deficit. EXAM: MRI HEAD WITHOUT CONTRAST TECHNIQUE: Multiplanar, multiecho pulse sequences of the brain and surrounding structures were obtained without intravenous contrast. COMPARISON:  CT head 07/11/2016 FINDINGS: Brain: Mild atrophy.  Negative for hydrocephalus. Negative for acute infarct. Mild periventricular white matter hyperintensity bilaterally. Negative for hemorrhage, mass, fluid collection. Vascular: Normal arterial flow voids. Skull and upper cervical spine: Negative Sinuses/Orbits: Mild mucosal edema paranasal sinuses. Bilateral cataract extraction Other: None IMPRESSION: Negative for acute infarct Mild atrophy and mild chronic microvascular ischemic change in the white matter. Electronically Signed   By: Franchot Gallo M.D.   On: 06/24/2021 14:53    Assessment & Plan:   Maurice Hart was seen today for hypertension.  Diagnoses and all orders for this visit:  Primary hypertension- He has not achieved his blood pressure goal of 130/80.  He will continue working on his lifestyle modifications.  Prediabetes- His A1c is at 6.3%.  Medical therapy is not yet indicated. -     Hemoglobin A1c; Future -     Hemoglobin A1c  Encounter for general adult medical examination with abnormal findings -     PSA; Future -     PSA   I am having Maurice Hart maintain his multivitamin, CALCIUM PO, tadalafil, azelastine, fluticasone, predniSONE, and meclizine.  No orders of the defined types were placed in this encounter.    Follow-up: Return in about 6 months (around 03/23/2022).  Scarlette Calico, MD

## 2021-09-20 NOTE — Patient Instructions (Signed)
Hypertension, Adult High blood pressure (hypertension) is when the force of blood pumping through the arteries is too strong. The arteries are the blood vessels that carry blood from the heart throughout the body. Hypertension forces the heart to work harder to pump blood and may cause arteries to become narrow or stiff. Untreated or uncontrolled hypertension can lead to a heart attack, heart failure, a stroke, kidney disease, and other problems. A blood pressure reading consists of a higher number over a lower number. Ideally, your blood pressure should be below 120/80. The first ("top") number is called the systolic pressure. It is a measure of the pressure in your arteries as your heart beats. The second ("bottom") number is called the diastolic pressure. It is a measure of the pressure in your arteries as the heart relaxes. What are the causes? The exact cause of this condition is not known. There are some conditions that result in high blood pressure. What increases the risk? Certain factors may make you more likely to develop high blood pressure. Some of these risk factors are under your control, including: Smoking. Not getting enough exercise or physical activity. Being overweight. Having too much fat, sugar, calories, or salt (sodium) in your diet. Drinking too much alcohol. Other risk factors include: Having a personal history of heart disease, diabetes, high cholesterol, or kidney disease. Stress. Having a family history of high blood pressure and high cholesterol. Having obstructive sleep apnea. Age. The risk increases with age. What are the signs or symptoms? High blood pressure may not cause symptoms. Very high blood pressure (hypertensive crisis) may cause: Headache. Fast or irregular heartbeats (palpitations). Shortness of breath. Nosebleed. Nausea and vomiting. Vision changes. Severe chest pain, dizziness, and seizures. How is this diagnosed? This condition is diagnosed by  measuring your blood pressure while you are seated, with your arm resting on a flat surface, your legs uncrossed, and your feet flat on the floor. The cuff of the blood pressure monitor will be placed directly against the skin of your upper arm at the level of your heart. Blood pressure should be measured at least twice using the same arm. Certain conditions can cause a difference in blood pressure between your right and left arms. If you have a high blood pressure reading during one visit or you have normal blood pressure with other risk factors, you may be asked to: Return on a different day to have your blood pressure checked again. Monitor your blood pressure at home for 1 week or longer. If you are diagnosed with hypertension, you may have other blood or imaging tests to help your health care provider understand your overall risk for other conditions. How is this treated? This condition is treated by making healthy lifestyle changes, such as eating healthy foods, exercising more, and reducing your alcohol intake. You may be referred for counseling on a healthy diet and physical activity. Your health care provider may prescribe medicine if lifestyle changes are not enough to get your blood pressure under control and if: Your systolic blood pressure is above 130. Your diastolic blood pressure is above 80. Your personal target blood pressure may vary depending on your medical conditions, your age, and other factors. Follow these instructions at home: Eating and drinking  Eat a diet that is high in fiber and potassium, and low in sodium, added sugar, and fat. An example of this eating plan is called the DASH diet. DASH stands for Dietary Approaches to Stop Hypertension. To eat this way: Eat   plenty of fresh fruits and vegetables. Try to fill one half of your plate at each meal with fruits and vegetables. Eat whole grains, such as whole-wheat pasta, brown rice, or whole-grain bread. Fill about one  fourth of your plate with whole grains. Eat or drink low-fat dairy products, such as skim milk or low-fat yogurt. Avoid fatty cuts of meat, processed or cured meats, and poultry with skin. Fill about one fourth of your plate with lean proteins, such as fish, chicken without skin, beans, eggs, or tofu. Avoid pre-made and processed foods. These tend to be higher in sodium, added sugar, and fat. Reduce your daily sodium intake. Many people with hypertension should eat less than 1,500 mg of sodium a day. Do not drink alcohol if: Your health care provider tells you not to drink. You are pregnant, may be pregnant, or are planning to become pregnant. If you drink alcohol: Limit how much you have to: 0-1 drink a day for women. 0-2 drinks a day for men. Know how much alcohol is in your drink. In the [place] one drink equals one 12 oz bottle of beer (355 mL), one 5 oz glass of wine (148 mL), or one 1 oz glass of hard liquor (44 mL). Lifestyle  Work with your health care provider to maintain a healthy body weight or to lose weight. Ask what an ideal weight is for you. Get at least 30 minutes of exercise that causes your heart to beat faster (aerobic exercise) most days of the week. Activities may include walking, swimming, or biking. Include exercise to strengthen your muscles (resistance exercise), such as Pilates or lifting weights, as part of your weekly exercise routine. Try to do these types of exercises for 30 minutes at least 3 days a week. Do not use any products that contain nicotine or tobacco. These products include cigarettes, chewing tobacco, and vaping devices, such as e-cigarettes. If you need help quitting, ask your health care provider. Monitor your blood pressure at home as told by your health care provider. Keep all follow-up visits. This is important. Medicines Take over-the-counter and prescription medicines only as told by your health care provider. Follow directions carefully. Blood  pressure medicines must be taken as prescribed. Do not skip doses of blood pressure medicine. Doing this puts you at risk for problems and can make the medicine less effective. Ask your health care provider about side effects or reactions to medicines that you should watch for. Contact a health care provider if you: Think you are having a reaction to a medicine you are taking. Have headaches that keep coming back (recurring). Feel dizzy. Have swelling in your ankles. Have trouble with your vision. Get help right away if you: Develop a severe headache or confusion. Have unusual weakness or numbness. Feel faint. Have severe pain in your chest or abdomen. Vomit repeatedly. Have trouble breathing. These symptoms may be an emergency. Get help right away. Call 911. Do not wait to see if the symptoms will go away. Do not drive yourself to the hospital. Summary Hypertension is when the force of blood pumping through your arteries is too strong. If this condition is not controlled, it may put you at risk for serious complications. Your personal target blood pressure may vary depending on your medical conditions, your age, and other factors. For most people, a normal blood pressure is less than 120/80. Hypertension is treated with lifestyle changes, medicines, or a combination of both. Lifestyle changes include losing weight, eating a healthy,   low-sodium diet, exercising more, and limiting alcohol. This information is not intended to replace advice given to you by your health care provider. Make sure you discuss any questions you have with your health care provider. Document Revised: 11/24/2020 Document Reviewed: 11/24/2020 Elsevier Patient Education  2023 Elsevier Inc.  

## 2021-12-15 ENCOUNTER — Other Ambulatory Visit (HOSPITAL_BASED_OUTPATIENT_CLINIC_OR_DEPARTMENT_OTHER): Payer: Self-pay

## 2021-12-15 MED ORDER — COMIRNATY 30 MCG/0.3ML IM SUSY
PREFILLED_SYRINGE | INTRAMUSCULAR | 0 refills | Status: DC
  Filled 2021-12-15: qty 0.3, 1d supply, fill #0

## 2022-03-17 DIAGNOSIS — U071 COVID-19: Secondary | ICD-10-CM | POA: Diagnosis not present

## 2022-04-18 DIAGNOSIS — K080 Exfoliation of teeth due to systemic causes: Secondary | ICD-10-CM | POA: Diagnosis not present

## 2022-05-16 DIAGNOSIS — K080 Exfoliation of teeth due to systemic causes: Secondary | ICD-10-CM | POA: Diagnosis not present

## 2022-07-14 ENCOUNTER — Ambulatory Visit (INDEPENDENT_AMBULATORY_CARE_PROVIDER_SITE_OTHER): Payer: Medicare Other | Admitting: Family Medicine

## 2022-07-14 ENCOUNTER — Encounter: Payer: Self-pay | Admitting: Family Medicine

## 2022-07-14 VITALS — BP 126/80 | HR 64 | Temp 97.3°F | Ht 72.0 in | Wt 233.0 lb

## 2022-07-14 DIAGNOSIS — Z808 Family history of malignant neoplasm of other organs or systems: Secondary | ICD-10-CM

## 2022-07-14 DIAGNOSIS — L989 Disorder of the skin and subcutaneous tissue, unspecified: Secondary | ICD-10-CM | POA: Insufficient documentation
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
## 2022-07-14 NOTE — Patient Instructions (Addendum)
You have been referred to [facility].  You may also call their office and schedule a visit  Continue using sun protection.

## 2022-07-18 ENCOUNTER — Telehealth: Payer: Self-pay | Admitting: Internal Medicine

## 2022-07-18 NOTE — Telephone Encounter (Signed)
Patient called about his referral to [facility].  [facility] states that they do not have the referral yet.  It will need to be faxed to them.  Fax #  [phone]  Referral #  [id number]  Attention:  New patient referral

## 2022-07-18 NOTE — Telephone Encounter (Signed)
Referral was faxed on 6/13, re-faxed referral order to [facility]. Received confirmation fax.

## 2022-07-21 DIAGNOSIS — L821 Other seborrheic keratosis: Secondary | ICD-10-CM | POA: Diagnosis not present

## 2022-07-21 DIAGNOSIS — L814 Other melanin hyperpigmentation: Secondary | ICD-10-CM | POA: Diagnosis not present

## 2022-07-21 DIAGNOSIS — L578 Other skin changes due to chronic exposure to nonionizing radiation: Secondary | ICD-10-CM | POA: Diagnosis not present

## 2022-07-21 DIAGNOSIS — D1801 Hemangioma of skin and subcutaneous tissue: Secondary | ICD-10-CM | POA: Diagnosis not present

## 2022-08-10 ENCOUNTER — Ambulatory Visit (INDEPENDENT_AMBULATORY_CARE_PROVIDER_SITE_OTHER): Payer: Medicare Other | Admitting: Internal Medicine

## 2022-08-10 ENCOUNTER — Encounter: Payer: Self-pay | Admitting: Internal Medicine

## 2022-08-10 VITALS — BP 142/84 | HR 62 | Temp 98.1°F | Resp 16 | Ht 72.0 in | Wt 237.0 lb

## 2022-08-10 DIAGNOSIS — Z125 Encounter for screening for malignant neoplasm of prostate: Secondary | ICD-10-CM

## 2022-08-10 DIAGNOSIS — R7303 Prediabetes: Secondary | ICD-10-CM

## 2022-08-10 DIAGNOSIS — I10 Essential (primary) hypertension: Secondary | ICD-10-CM

## 2022-08-10 DIAGNOSIS — E785 Hyperlipidemia, unspecified: Secondary | ICD-10-CM | POA: Diagnosis not present

## 2022-08-10 DIAGNOSIS — Z0001 Encounter for general adult medical examination with abnormal findings: Secondary | ICD-10-CM | POA: Diagnosis not present

## 2022-08-10 DIAGNOSIS — J309 Allergic rhinitis, unspecified: Secondary | ICD-10-CM | POA: Diagnosis not present

## 2022-08-10 LAB — CBC WITH DIFFERENTIAL/PLATELET
Basophils Absolute: 0.1 10*3/uL (ref 0.0–0.1)
Basophils Relative: 1.2 % (ref 0.0–3.0)
Eosinophils Absolute: 0.1 10*3/uL (ref 0.0–0.7)
Eosinophils Relative: 2.9 % (ref 0.0–5.0)
HCT: 45.6 % (ref 39.0–52.0)
Hemoglobin: 15.2 g/dL (ref 13.0–17.0)
Lymphocytes Relative: 33.1 % (ref 12.0–46.0)
Lymphs Abs: 1.5 10*3/uL (ref 0.7–4.0)
MCHC: 33.3 g/dL (ref 30.0–36.0)
MCV: 88.1 fl (ref 78.0–100.0)
Monocytes Absolute: 0.5 10*3/uL (ref 0.1–1.0)
Monocytes Relative: 11.8 % (ref 3.0–12.0)
Neutro Abs: 2.3 10*3/uL (ref 1.4–7.7)
Neutrophils Relative %: 51 % (ref 43.0–77.0)
Platelets: 176 10*3/uL (ref 150.0–400.0)
RBC: 5.17 Mil/uL (ref 4.22–5.81)
RDW: 13.2 % (ref 11.5–15.5)
WBC: 4.5 10*3/uL (ref 4.0–10.5)

## 2022-08-10 LAB — LIPID PANEL
Cholesterol: 183 mg/dL (ref 0–200)
HDL: 35.5 mg/dL — ABNORMAL LOW (ref >39.00)
LDL Cholesterol: 125 mg/dL — ABNORMAL HIGH (ref 0–99)
NonHDL: 147.55
Total CHOL/HDL Ratio: 5
Triglycerides: 114 mg/dL (ref 0.0–149.0)
VLDL: 22.8 mg/dL (ref 0.0–40.0)

## 2022-08-10 LAB — URINALYSIS, ROUTINE W REFLEX MICROSCOPIC
Bilirubin Urine: NEGATIVE
Hgb urine dipstick: NEGATIVE
Ketones, ur: NEGATIVE
Leukocytes,Ua: NEGATIVE
Nitrite: NEGATIVE
RBC / HPF: NONE SEEN (ref 0–?)
Specific Gravity, Urine: 1.02 (ref 1.000–1.030)
Total Protein, Urine: NEGATIVE
Urine Glucose: NEGATIVE
Urobilinogen, UA: 0.2 (ref 0.0–1.0)
pH: 6 (ref 5.0–8.0)

## 2022-08-10 LAB — BASIC METABOLIC PANEL
BUN: 17 mg/dL (ref 6–23)
CO2: 27 mEq/L (ref 19–32)
Calcium: 9.4 mg/dL (ref 8.4–10.5)
Chloride: 104 mEq/L (ref 96–112)
Creatinine, Ser: 1.19 mg/dL (ref 0.40–1.50)
GFR: 63.38 mL/min (ref >60.00)
Glucose, Bld: 116 mg/dL — ABNORMAL HIGH (ref 70–99)
Potassium: 4.2 mEq/L (ref 3.5–5.1)
Sodium: 139 mEq/L (ref 135–145)

## 2022-08-10 LAB — HEPATIC FUNCTION PANEL
ALT: 16 U/L (ref 0–53)
AST: 19 U/L (ref 0–37)
Albumin: 4.3 g/dL (ref 3.5–5.2)
Alkaline Phosphatase: 52 U/L (ref 39–117)
Bilirubin, Direct: 0.2 mg/dL (ref 0.0–0.3)
Total Bilirubin: 1 mg/dL (ref 0.2–1.2)
Total Protein: 6.6 g/dL (ref 6.0–8.3)

## 2022-08-10 LAB — PSA: PSA: 1.87 ng/mL (ref 0.10–4.00)

## 2022-08-10 LAB — TSH: TSH: 3.56 u[IU]/mL (ref 0.35–5.50)

## 2022-08-10 LAB — HEMOGLOBIN A1C: Hgb A1c MFr Bld: 6.1 % (ref 4.6–6.5)

## 2022-08-10 MED ORDER — OLMESARTAN MEDOXOMIL 20 MG PO TABS
20.0000 mg | ORAL_TABLET | Freq: Every day | ORAL | 1 refills | Status: DC
Start: 2022-08-10 — End: 2023-04-03

## 2022-08-10 MED ORDER — LEVOCETIRIZINE DIHYDROCHLORIDE 5 MG PO TABS
5.0000 mg | ORAL_TABLET | Freq: Every evening | ORAL | 1 refills | Status: DC
Start: 2022-08-10 — End: 2023-05-02

## 2022-08-10 MED ORDER — ROSUVASTATIN CALCIUM 20 MG PO TABS
20.0000 mg | ORAL_TABLET | Freq: Every day | ORAL | 1 refills | Status: DC
Start: 2022-08-10 — End: 2023-04-03

## 2022-08-10 NOTE — Progress Notes (Unsigned)
Subjective:  Patient ID: Maurice Hart, male    DOB: 04/13/1955  Age: 67 y.o. MRN: [id number]  CC: Annual Exam, Hypertension, and Hyperlipidemia   HPI Maurice Hart presents for a CPX and f/up ---  Discussed the use of AI scribe software for clinical note transcription with the patient, who gave verbal consent to proceed.  History of Present Illness   The patient, a retiree with a history of allergies and erectile dysfunction, has been maintaining good health and an active lifestyle, walking approximately 2.5-3 miles daily without any chest pain or radiating arm pain. He denies any leg cramping or other issues.   Earlier in the year, the patient contracted COVID-19 during a visit to [place]. He does not report any residual symptoms such as shortness of breath or coughing. He has been managing his allergies effectively with daily use of fluconazole and Xyzal, which has significantly improved his previous symptoms of sniffles, eye irritation, and nocturnal breathing difficulties.  The patient's prescription for Cialis, used for erectile dysfunction, ran out approximately 1.5-2 months ago. He reports an improvement in his condition, possibly due to his overall better health and regular exercise.        Outpatient Medications Prior to Visit  Medication Sig Dispense Refill   azelastine (OPTIVAR) 0.05 % ophthalmic solution Place 1 drop into the left eye 2 (two) times daily. 6 mL 2   CALCIUM PO Take by mouth daily.     fluticasone (FLONASE) 50 MCG/ACT nasal spray Place 2 sprays into both nostrils daily. 16 g 6   Multiple Vitamin (MULTIVITAMIN) tablet Take 1 tablet by mouth daily.     tadalafil (CIALIS) 20 MG tablet Take 0.5-1 tablets (10-20 mg total) by mouth every other day as needed for erectile dysfunction. 10 tablet 3   No facility-administered medications prior to visit.    ROS Review of Systems  Objective:  BP (!) 142/84 (BP Location: Left Arm, Patient Position: Sitting, Cuff Size:  Large)   Pulse 62   Temp 98.1 F (36.7 C) (Oral)   Resp 16   Ht 6' (1.829 m)   Wt 237 lb (107.5 kg)   SpO2 96%   BMI 32.14 kg/m   BP Readings from Last 3 Encounters:  08/10/22 (!) 142/84  07/14/22 126/80  09/20/21 138/84    Wt Readings from Last 3 Encounters:  08/10/22 237 lb (107.5 kg)  07/14/22 233 lb (105.7 kg)  09/20/21 240 lb (108.9 kg)    Physical Exam Cardiovascular:     Comments: EKG- NSR, 70 bpm LAD, RBBB - unchanged No LVH Musculoskeletal:     Right lower leg: No edema.     Left lower leg: No edema.     Lab Results  Component Value Date   WBC 4.5 08/10/2022   HGB 15.2 08/10/2022   HCT 45.6 08/10/2022   PLT 176.0 08/10/2022   GLUCOSE 116 (H) 08/10/2022   CHOL 183 08/10/2022   TRIG 114.0 08/10/2022   HDL 35.50 (L) 08/10/2022   LDLCALC 125 (H) 08/10/2022   ALT 16 08/10/2022   AST 19 08/10/2022   NA 139 08/10/2022   K 4.2 08/10/2022   CL 104 08/10/2022   CREATININE 1.19 08/10/2022   BUN 17 08/10/2022   CO2 27 08/10/2022   TSH 3.56 08/10/2022   PSA 1.87 08/10/2022   HGBA1C 6.1 08/10/2022    MR Brain Wo Contrast  Result Date: 06/24/2021 CLINICAL DATA:  Acute neuro deficit. EXAM: MRI HEAD WITHOUT CONTRAST TECHNIQUE: Multiplanar,  multiecho pulse sequences of the brain and surrounding structures were obtained without intravenous contrast. COMPARISON:  CT head 07/11/2016 FINDINGS: Brain: Mild atrophy.  Negative for hydrocephalus. Negative for acute infarct. Mild periventricular white matter hyperintensity bilaterally. Negative for hemorrhage, mass, fluid collection. Vascular: Normal arterial flow voids. Skull and upper cervical spine: Negative Sinuses/Orbits: Mild mucosal edema paranasal sinuses. Bilateral cataract extraction Other: None IMPRESSION: Negative for acute infarct Mild atrophy and mild chronic microvascular ischemic change in the white matter. Electronically Signed   By: Marlan Palau M.D.   On: 06/24/2021 14:53    Assessment & Plan:   Hyperlipidemia LDL goal <130 -     Lipoprotein A (LPA); Future -     Lipid panel; Future -     TSH; Future -     Hepatic function panel; Future -     Rosuvastatin Calcium; Take 1 tablet (20 mg total) by mouth daily.  Dispense: 90 tablet; Refill: 1  Prediabetes -     Basic metabolic panel; Future -     Hemoglobin A1c; Future  Primary hypertension -     CBC with Differential/Platelet; Future -     Basic metabolic panel; Future -     TSH; Future -     Hepatic function panel; Future -     Urinalysis, Routine w reflex microscopic; Future -     EKG 12-Lead -     Olmesartan Medoxomil; Take 1 tablet (20 mg total) by mouth daily.  Dispense: 90 tablet; Refill: 1  Allergic rhinitis, unspecified seasonality, unspecified trigger -     Levocetirizine Dihydrochloride; Take 1 tablet (5 mg total) by mouth every evening.  Dispense: 90 tablet; Refill: 1  Encounter for general adult medical examination with abnormal findings  Screening for prostate cancer -     PSA; Future     Follow-up: Return in about 6 months (around 02/10/2023).  Sanda Linger, MD

## 2022-08-10 NOTE — Patient Instructions (Signed)
Health Maintenance, Male Adopting a healthy lifestyle and getting preventive care are important in promoting health and wellness. Ask your health care provider about: The right schedule for you to have regular tests and exams. Things you can do on your own to prevent diseases and keep yourself healthy. What should I know about diet, weight, and exercise? Eat a healthy diet  Eat a diet that includes plenty of vegetables, fruits, low-fat dairy products, and lean protein. Do not eat a lot of foods that are high in solid fats, added sugars, or sodium. Maintain a healthy weight Body mass index (BMI) is a measurement that can be used to identify possible weight problems. It estimates body fat based on height and weight. Your health care provider can help determine your BMI and help you achieve or maintain a healthy weight. Get regular exercise Get regular exercise. This is one of the most important things you can do for your health. Most adults should: Exercise for at least 150 minutes each week. The exercise should increase your heart rate and make you sweat (moderate-intensity exercise). Do strengthening exercises at least twice a week. This is in addition to the moderate-intensity exercise. Spend less time sitting. Even light physical activity can be beneficial. Watch cholesterol and blood lipids Have your blood tested for lipids and cholesterol at 67 years of age, then have this test every 5 years. You may need to have your cholesterol levels checked more often if: Your lipid or cholesterol levels are high. You are older than 67 years of age. You are at high risk for heart disease. What should I know about cancer screening? Many types of cancers can be detected early and may often be prevented. Depending on your health history and family history, you may need to have cancer screening at various ages. This may include screening for: Colorectal cancer. Prostate cancer. Skin cancer. Lung  cancer. What should I know about heart disease, diabetes, and high blood pressure? Blood pressure and heart disease High blood pressure causes heart disease and increases the risk of stroke. This is more likely to develop in people who have high blood pressure readings or are overweight. Talk with your health care provider about your target blood pressure readings. Have your blood pressure checked: Every 3-5 years if you are 18-39 years of age. Every year if you are 40 years old or older. If you are between the ages of 65 and 75 and are a current or former smoker, ask your health care provider if you should have a one-time screening for abdominal aortic aneurysm (AAA). Diabetes Have regular diabetes screenings. This checks your fasting blood sugar level. Have the screening done: Once every three years after age 45 if you are at a normal weight and have a low risk for diabetes. More often and at a younger age if you are overweight or have a high risk for diabetes. What should I know about preventing infection? Hepatitis B If you have a higher risk for hepatitis B, you should be screened for this virus. Talk with your health care provider to find out if you are at risk for hepatitis B infection. Hepatitis C Blood testing is recommended for: Everyone born from 1945 through 1965. Anyone with known risk factors for hepatitis C. Sexually transmitted infections (STIs) You should be screened each year for STIs, including gonorrhea and chlamydia, if: You are sexually active and are younger than 67 years of age. You are older than 67 years of age and your   health care provider tells you that you are at risk for this type of infection. Your sexual activity has changed since you were last screened, and you are at increased risk for chlamydia or gonorrhea. Ask your health care provider if you are at risk. Ask your health care provider about whether you are at high risk for HIV. Your health care provider  may recommend a prescription medicine to help prevent HIV infection. If you choose to take medicine to prevent HIV, you should first get tested for HIV. You should then be tested every 3 months for as long as you are taking the medicine. Follow these instructions at home: Alcohol use Do not drink alcohol if your health care provider tells you not to drink. If you drink alcohol: Limit how much you have to 0-2 drinks a day. Know how much alcohol is in your drink. In the [place] one drink equals one 12 oz bottle of beer (355 mL), one 5 oz glass of wine (148 mL), or one 1 oz glass of hard liquor (44 mL). Lifestyle Do not use any products that contain nicotine or tobacco. These products include cigarettes, chewing tobacco, and vaping devices, such as e-cigarettes. If you need help quitting, ask your health care provider. Do not use street drugs. Do not share needles. Ask your health care provider for help if you need support or information about quitting drugs. General instructions Schedule regular health, dental, and eye exams. Stay current with your vaccines. Tell your health care provider if: You often feel depressed. You have ever been abused or do not feel safe at home. Summary Adopting a healthy lifestyle and getting preventive care are important in promoting health and wellness. Follow your health care provider's instructions about healthy diet, exercising, and getting tested or screened for diseases. Follow your health care provider's instructions on monitoring your cholesterol and blood pressure. This information is not intended to replace advice given to you by your health care provider. Make sure you discuss any questions you have with your health care provider. Document Revised: 06/08/2020 Document Reviewed: 06/08/2020 Elsevier Patient Education  2024 [employer] Inc[employer]  

## 2022-08-11 LAB — LIPOPROTEIN A (LPA): Lipoprotein (a): 31 nmol/L (ref <75)

## 2022-10-18 ENCOUNTER — Other Ambulatory Visit (HOSPITAL_COMMUNITY): Payer: Self-pay

## 2022-10-24 DIAGNOSIS — K080 Exfoliation of teeth due to systemic causes: Secondary | ICD-10-CM | POA: Diagnosis not present

## 2022-12-07 ENCOUNTER — Other Ambulatory Visit (HOSPITAL_BASED_OUTPATIENT_CLINIC_OR_DEPARTMENT_OTHER): Payer: Self-pay

## 2022-12-07 MED ORDER — COVID-19 MRNA VAC-TRIS(PFIZER) 30 MCG/0.3ML IM SUSY
0.3000 mL | PREFILLED_SYRINGE | Freq: Once | INTRAMUSCULAR | 0 refills | Status: AC
  Filled 2022-12-07: qty 0.3, 1d supply, fill #0

## 2022-12-20 DIAGNOSIS — Z0289 Encounter for other administrative examinations: Secondary | ICD-10-CM

## 2022-12-27 DIAGNOSIS — H52223 Regular astigmatism, bilateral: Secondary | ICD-10-CM | POA: Diagnosis not present

## 2022-12-27 DIAGNOSIS — H524 Presbyopia: Secondary | ICD-10-CM | POA: Diagnosis not present

## 2023-01-12 ENCOUNTER — Other Ambulatory Visit: Payer: Self-pay

## 2023-01-12 ENCOUNTER — Ambulatory Visit (HOSPITAL_COMMUNITY)
Admission: RE | Admit: 2023-01-12 | Discharge: 2023-01-12 | Disposition: A | Discharge status: Home / Self Care | Payer: Medicare Other | Source: Ambulatory Visit | Attending: Family Medicine | Admitting: Family Medicine

## 2023-01-12 ENCOUNTER — Encounter (INDEPENDENT_AMBULATORY_CARE_PROVIDER_SITE_OTHER): Payer: Self-pay | Admitting: Family Medicine

## 2023-01-12 ENCOUNTER — Ambulatory Visit (INDEPENDENT_AMBULATORY_CARE_PROVIDER_SITE_OTHER): Payer: Medicare Other | Admitting: Family Medicine

## 2023-01-12 VITALS — BP 122/74 | HR 58 | Temp 97.7°F | Ht 71.0 in | Wt 221.0 lb

## 2023-01-12 DIAGNOSIS — E785 Hyperlipidemia, unspecified: Secondary | ICD-10-CM

## 2023-01-12 DIAGNOSIS — R7303 Prediabetes: Secondary | ICD-10-CM | POA: Diagnosis not present

## 2023-01-12 DIAGNOSIS — Z1331 Encounter for screening for depression: Secondary | ICD-10-CM

## 2023-01-12 DIAGNOSIS — I10 Essential (primary) hypertension: Secondary | ICD-10-CM

## 2023-01-12 DIAGNOSIS — R0602 Shortness of breath: Secondary | ICD-10-CM | POA: Diagnosis not present

## 2023-01-12 DIAGNOSIS — R5383 Other fatigue: Secondary | ICD-10-CM | POA: Diagnosis not present

## 2023-01-12 DIAGNOSIS — E669 Obesity, unspecified: Secondary | ICD-10-CM

## 2023-01-12 DIAGNOSIS — R001 Bradycardia, unspecified: Secondary | ICD-10-CM | POA: Insufficient documentation

## 2023-01-12 DIAGNOSIS — Z6830 Body mass index (BMI) 30.0-30.9, adult: Secondary | ICD-10-CM

## 2023-01-12 NOTE — Progress Notes (Incomplete)
Maurice Hart, D.O.  ABFM, ABOM Specializing in Clinical Bariatric Medicine Office located at: [address] [place], [place]  [address]   Bariatric Medicine Visit  Dear Maurice Grandchild, MD   Thank you for referring Maurice Hart to our clinic today for evaluation.  We performed a consultation to discuss his options for treatment and educate the patient on his disease state.  The following note includes my evaluation and treatment recommendations.   Please do not hesitate to reach out to me directly if you have any further concerns.   Assessment and Plan:   FOR THE DISEASE OF OBESITY: Obesity (BMI 30-39.9) Assessment & Plan:  Recommended Dietary Goals Maurice Hart is currently in the action stage of change. As such, his goal is to continue weight management plan.  He has agreed to: keep a food journal with a target of 1450-1550 calories and 100+ grams of protein daily   Behavioral Intervention We discussed the following  today: begin to work on maintaining a reduced calorie state, getting the recommended amount of protein, incorporating whole foods, making healthy choices, staying well hydrated and practicing mindfulness when eating.  Additional resources provided today: handout on Food journaling log, handout on Healthy Eating Principles.   Evidence-based interventions for health behavior change were utilized today including the discussion of self monitoring techniques, problem-solving barriers and SMART goal setting techniques.   Regarding patient's less desirable eating habits and patterns, we employed the technique of small changes.   Pt will specifically work on: n/a   Recommended Physical Activity Goals Maurice Hart has been advised to work up to 150 minutes of moderate intensity aerobic activity a week and strengthening exercises 2-3 times per week for cardiovascular health, weight loss maintenance and preservation of muscle mass.   He has agreed to : Continue current level  of physical activity    Pharmacotherapy We both agreed to : begin with nutritional and behavioral strategies   FOR ASSOCIATED CONDITIONS ADDRESSED TODAY:  Fatigue Assessment & Plan: Maurice Hart does feel that his weight is causing his energy to be lower than it should be. Fatigue may be related to obesity, depression or many other causes. he does not appear to have any red flag symptoms and this appears to most likely be related to his current lifestyle habits and dietary intake.  Labs will be ordered and reviewed with him at their next office visit in two weeks.  Epworth sleepiness scale score appears to be within normal limits.  His ESS score is 5.  Maurice Hart denies daytime somnolence and denies waking up still tired.  Maurice Hart generally gets 6 or 7 hours of sleep per night, and states that he has generally restful sleep. Snoring is present. Apneic episodes is not present.   I recommended pt to aim for 7-9 hrs of sleep daily for the best weight loss results and for overall well being.   ECG: ******Previous ECG done on 08/10/22 shows similar findings to today. Rate sinus brady cardia and RBBB reassuring without any acute abnormalities, will continue to monitor for symptoms.    Shortness of breath on exertion Assessment & Plan: Maurice Hart does feel that he gets out of breath more easily than he used to when he exercises and seems to be worsening over time with weight gain.  This has gotten worse recently. Maurice Hart denies shortness of breath at rest or orthopnea. Maurice Hart's shortness of breath appears to be obesity related and exercise induced, as they do not appear to have  any "red flag" symptoms/ concerns today.  Also, this condition appears to be related to a state of poor cardiovascular conditioning   Obtain labs today and will be reviewed with him at their next office visit in two weeks.  Indirect Calorimeter completed today to help guide our dietary regimen. It shows a VO2 of 297 and a REE of 2045.  His calculated  basal metabolic rate is 1610 thus his measured basal metabolic rate is essentially the same.   Patient agreed to work on weight loss at this time.  As Maurice Hart progresses through our weight loss program, we will gradually increase exercise as tolerated to treat his current condition.   If Maurice Hart follows our recommendations and loses 5-10% of their weight without improvement of his shortness of breath or if at any time, symptoms become more concerning, they agree to urgently follow up with their PCP/ specialist for further consideration/ evaluation.   Maurice Hart verbalizes agreement with this plan.    Depression screening Assessment & Plan: His Food and Mood (modified PHQ-9) score was 3 . No concerns in this regard.    Prediabetes Assessment & Plan: Most recent Hemoglobin A1c:  Lab Results  Component Value Date   HGBA1C 6.1 08/10/2022   HGBA1C 6.3 09/20/2021   HGBA1C 6.3 02/11/2021    He endorses being diagnosed with prediabetes roughly 10 yrs ago. He has never been on a prediabetic medication. He will begin to decrease simple carbs/ sugars; increase fiber and proteins -> follow his meal plan. Check labs.   Orders:  -     CBC with Differential/Platelet -     Hemoglobin A1c -     Insulin, random -     VITAMIN D 25 Hydroxy (Vit-D Deficiency, Fractures) -     Vitamin B12 -     TSH -     Folate   Hyperlipidemia LDL goal <130 Assessment & Plan: Most recent lipid panel:  Lab Results  Component Value Date   CHOL 183 08/10/2022   HDL 35.50 (L) 08/10/2022   LDLCALC 125 (H) 08/10/2022   TRIG 114.0 08/10/2022   CHOLHDL 5 08/10/2022   We are unable to see medical records from his PCP, who is with [facility]. Per pt, he was given Crestor by his PCP around July. He opted not to take it and focus on diet/exercise. Pt has no family hx of early coronary artery disease.  Will check labs today. He agrees to begin our treatment plan of a heart-healthy, low-cholesterol meal plan. Will increase exercise as  tolerated in the future.   Orders: -     Comprehensive metabolic panel -     Lipid panel   Primary hypertension Assessment & Plan: Last 3 blood pressure readings in our office are as follows: BP Readings from Last 3 Encounters:  01/12/23 122/74  08/10/22 (!) 142/84  07/14/22 126/80   We are unable to see medical records from his PCP, who is with [facility]. Per pt, he was given Benicar by his PCP around July. He opted not to take it and focus on diet/exercise. His blood pressure at home has been stable: 110-120/upper 60s-70s. His blood pressure is stable today as well. Begin prudent nutritional plan and low sodium diet, advance exercise as tolerated.    Orders: -     Comprehensive metabolic panel -     CBC with Differential/Platelet   FOLLOW UP:   Follow up in 2 weeks. He was informed of the importance of frequent follow up visits  to maximize his success with intensive lifestyle modifications for his multiple health conditions.  Maurice Hart is aware that we will review all of his lab results at our next visit.  He is aware that if anything is critical/ life threatening with the results, we will be contacting him via MyChart prior to the office visit to discuss management.    Chief Complaint:   OBESITY Maurice Hart (MR# [id number]) is a pleasant 67 y.o. male who presents for evaluation and treatment of obesity and related comorbidities. Current BMI is Body mass index is 30.82 kg/m. Maurice Hart has been struggling with his weight for many years and has been unsuccessful in either losing weight, maintaining weight loss, or reaching his healthy weight goal.  Maurice Hart is currently in the action stage of change and ready to dedicate time achieving and maintaining a healthier weight. Maurice Hart is interested in becoming our patient and working on intensive lifestyle modifications including (but not limited to) diet and exercise for weight loss.  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 Patient is married to Gasper Sells, a current pt of mine    He lives with his wife.  He walks 1.5 hrs daily and occasionally does weight lifting.   Desires to be 190 lbs or less   Reasons for wanting to lose weight: improve health stamina and to improve in care-giving role.   Pt started eating healthy 4-6 weeks ago when his wife joined our program. Has lost 9-10 lbs. Does endorse having constipation - possibly from the increased protein intake. Symptoms are improving.   Eats out less than one time a week  Craves spaghetti, pasta and [place] food.   Snacks on nuts, chips, crackers with peanut butter, and sweets.  Sometimes skips breakfast   Drinks black coffee regularly. Drinks a beer or wine - once a day on average.   Worst food habit: holiday candy and bakery items.   Subjective:   This is the patient's first visit at [facility].  The patient's NEW PATIENT PACKET that they filled out prior to today's office visit was reviewed at length and information from that paperwork was included within the following office visit note.    Included in the packet: current and past health history, medications, allergies, ROS, gynecologic history (women only), surgical history, family history, social history, weight history, weight loss surgery history (for those that have had weight loss surgery), nutritional evaluation, mood and food questionnaire along with a depression screening (PHQ9) on all patients, an Epworth questionnaire, sleep habits questionnaire, patient life and health improvement goals questionnaire. These will all be scanned into the patient's chart under the "media" tab.   Review of Systems: Please refer to new patient packet scanned into media. Pertinent positives were addressed with patient today.  Reviewed by clinician on day of visit: allergies, medications, problem list, medical history, surgical history, family history,  social history, and previous encounter notes.  During the visit, I independently reviewed the patient's EKG, bioimpedance scale results, and indirect calorimeter results. I used this information to tailor a meal plan for the patient that will help Maurice Hart to lose weight and will improve his obesity-related conditions going forward.  I performed a medically necessary appropriate examination and/or evaluation. I discussed the assessment and treatment plan with the patient. The patient was provided an opportunity to ask questions and all were answered. The patient agreed with  the plan and demonstrated an understanding of the instructions. Labs were ordered today (unless patient declined them) and will be reviewed with the patient at our next visit unless more critical results need to be addressed immediately. Clinical information was updated and documented in the EMR.   Objective:   PHYSICAL EXAM: Blood pressure 122/74, pulse (!) 58, temperature 97.7 F (36.5 C), height 5\' 11"  (1.803 m), weight 221 lb (100.2 kg), SpO2 99%. Body mass index is 30.82 kg/m.  General: Well Developed, well nourished, and in no acute distress.  HEENT: Normocephalic, atraumatic; EOMI, sclerae are anicteric. Skin: Warm and dry, good turgor Chest:  Normal excursion, shape, no gross ABN Respiratory: No conversational dyspnea; speaking in full sentences NeuroM-Sk:  Normal gross ROM * 4 extremities  Psych: A and O *3, insight adequate, mood- full   Anthropometric Measurements Height: 5\' 11"  (1.803 m) Weight: 221 lb (100.2 kg) BMI (Calculated): 30.84 Weight at Last Visit: N/A Weight Lost Since Last Visit: N/A Weight Gained Since Last Visit: N/A Starting Weight: 221 lb Peak Weight: 258 lb Waist Measurement : 44 inches   Body Composition  Body Fat %: 31.5 % Fat Mass (lbs): 69.8 lbs Muscle Mass (lbs): 144.2 lbs Total Body Water (lbs): 99 lbs Visceral Fat Rating : 18   Other Clinical Data Fasting: yes Labs:  yes Starting Date: 01/12/23 Comments: FIRST VISIT   DIAGNOSTIC DATA REVIEWED:  BMET    Component Value Date/Time   NA 139 08/10/2022 0855   NA 139 10/22/2018 1523   K 4.2 08/10/2022 0855   CL 104 08/10/2022 0855   CO2 27 08/10/2022 0855   GLUCOSE 116 (H) 08/10/2022 0855   BUN 17 08/10/2022 0855   BUN 18 10/22/2018 1523   CREATININE 1.19 08/10/2022 0855   CREATININE 1.19 07/10/2020 0853   CALCIUM 9.4 08/10/2022 0855   GFRNONAA 75 10/22/2018 1523   GFRNONAA 62 09/01/2015 1445   GFRAA 87 10/22/2018 1523   GFRAA 72 09/01/2015 1445   Lab Results  Component Value Date   HGBA1C 6.1 08/10/2022   HGBA1C 6.4 (H) 09/01/2015   No results found for: "INSULIN" Lab Results  Component Value Date   TSH 3.56 08/10/2022   CBC    Component Value Date/Time   WBC 4.5 08/10/2022 0855   RBC 5.17 08/10/2022 0855   HGB 15.2 08/10/2022 0855   HCT 45.6 08/10/2022 0855   PLT 176.0 08/10/2022 0855   MCV 88.1 08/10/2022 0855   MCH 29.7 07/10/2020 0853   MCHC 33.3 08/10/2022 0855   RDW 13.2 08/10/2022 0855   Iron Studies No results found for: "IRON", "TIBC", "FERRITIN", "IRONPCTSAT" Lipid Panel     Component Value Date/Time   CHOL 183 08/10/2022 0855   CHOL 164 10/22/2018 1523   TRIG 114.0 08/10/2022 0855   HDL 35.50 (L) 08/10/2022 0855   HDL 44 10/22/2018 1523   CHOLHDL 5 08/10/2022 0855   VLDL 22.8 08/10/2022 0855   LDLCALC 125 (H) 08/10/2022 0855   LDLCALC 121 (H) 07/10/2020 0853   Hepatic Function Panel     Component Value Date/Time   PROT 6.6 08/10/2022 0855   PROT 6.5 10/22/2018 1523   ALBUMIN 4.3 08/10/2022 0855   ALBUMIN 4.3 10/22/2018 1523   AST 19 08/10/2022 0855   ALT 16 08/10/2022 0855   ALKPHOS 52 08/10/2022 0855   BILITOT 1.0 08/10/2022 0855   BILITOT 0.7 10/22/2018 1523   BILIDIR 0.2 08/10/2022 0855      Component Value Date/Time   TSH  3.56 08/10/2022 0855   Nutritional No results found for: "VD25OH"  Attestation Statements:   I, Special Puri,  acting as a Stage manager for [employer], DO., have compiled all relevant documentation for today's office visit on behalf of Thomasene Lot, DO, while in the presence of [employer], DO.  Reviewed by clinician on day of visit: allergies, medications, problem list, medical history, surgical history, family history, social history, and previous encounter notes pertinent to patient's obesity diagnosis. I have spent 60 minutes in the care of the patient today including: preparing to see patient (e.g. review and interpretation of tests, old notes ), obtaining and/or reviewing separately obtained history, performing a medically appropriate examination or evaluation, counseling and educating the patient, ordering medications, test or procedures, documenting clinical information in the electronic or other health care record, and independently interpreting results and communicating results to the patient, family, or caregiver   I have reviewed the above documentation for accuracy and completeness, and I agree with the above. Maurice Hart, D.O.  The 21st Century Cures Act was signed into law in 2016 which includes the topic of electronic health records.  This provides immediate access to information in MyChart.  This includes consultation notes, operative notes, office notes, lab results and pathology reports.  If you have any questions about what you read please let us know at your next visit so we can discuss your concerns and take corrective action if need be.  We are right here with you.

## 2023-01-13 LAB — VITAMIN B12: Vitamin B-12: 510 pg/mL (ref 232–1245)

## 2023-01-13 LAB — CBC WITH DIFFERENTIAL/PLATELET
Basophils Absolute: 0.1 10*3/uL (ref 0.0–0.2)
Basos: 1 %
EOS (ABSOLUTE): 0.1 10*3/uL (ref 0.0–0.4)
Eos: 3 %
Hematocrit: 51.2 % — ABNORMAL HIGH (ref 37.5–51.0)
Hemoglobin: 16.5 g/dL (ref 13.0–17.7)
Immature Grans (Abs): 0 10*3/uL (ref 0.0–0.1)
Immature Granulocytes: 0 %
Lymphocytes Absolute: 1.2 10*3/uL (ref 0.7–3.1)
Lymphs: 31 %
MCH: 29.2 pg (ref 26.6–33.0)
MCHC: 32.2 g/dL (ref 31.5–35.7)
MCV: 91 fL (ref 79–97)
Monocytes Absolute: 0.4 10*3/uL (ref 0.1–0.9)
Monocytes: 11 %
Neutrophils Absolute: 2.2 10*3/uL (ref 1.4–7.0)
Neutrophils: 54 %
Platelets: 183 10*3/uL (ref 150–450)
RBC: 5.65 x10E6/uL (ref 4.14–5.80)
RDW: 11.9 % (ref 11.6–15.4)
WBC: 4 10*3/uL (ref 3.4–10.8)

## 2023-01-13 LAB — LIPID PANEL
Chol/HDL Ratio: 4.7 {ratio} (ref 0.0–5.0)
Cholesterol, Total: 178 mg/dL (ref 100–199)
HDL: 38 mg/dL — ABNORMAL LOW (ref >39)
LDL Chol Calc (NIH): 118 mg/dL — ABNORMAL HIGH (ref 0–99)
Triglycerides: 122 mg/dL (ref 0–149)
VLDL Cholesterol Cal: 22 mg/dL (ref 5–40)

## 2023-01-13 LAB — COMPREHENSIVE METABOLIC PANEL
ALT: 16 [IU]/L (ref 0–44)
AST: 22 [IU]/L (ref 0–40)
Albumin: 4.7 g/dL (ref 3.9–4.9)
Alkaline Phosphatase: 62 [IU]/L (ref 44–121)
BUN/Creatinine Ratio: 14 (ref 10–24)
BUN: 18 mg/dL (ref 8–27)
Bilirubin Total: 0.6 mg/dL (ref 0.0–1.2)
CO2: 23 mmol/L (ref 20–29)
Calcium: 9.6 mg/dL (ref 8.6–10.2)
Chloride: 102 mmol/L (ref 96–106)
Creatinine, Ser: 1.3 mg/dL — ABNORMAL HIGH (ref 0.76–1.27)
Globulin, Total: 2.5 g/dL (ref 1.5–4.5)
Glucose: 111 mg/dL — ABNORMAL HIGH (ref 70–99)
Potassium: 4.5 mmol/L (ref 3.5–5.2)
Sodium: 142 mmol/L (ref 134–144)
Total Protein: 7.2 g/dL (ref 6.0–8.5)
eGFR: 60 mL/min/{1.73_m2} (ref >59)

## 2023-01-13 LAB — FOLATE: Folate: 18.9 ng/mL (ref >3.0)

## 2023-01-13 LAB — TSH: TSH: 2.61 u[IU]/mL (ref 0.450–4.500)

## 2023-01-13 LAB — HEMOGLOBIN A1C
Est. average glucose Bld gHb Est-mCnc: 123 mg/dL
Hgb A1c MFr Bld: 5.9 % — ABNORMAL HIGH (ref 4.8–5.6)

## 2023-01-13 LAB — VITAMIN D 25 HYDROXY (VIT D DEFICIENCY, FRACTURES): Vit D, 25-Hydroxy: 27.1 ng/mL — ABNORMAL LOW (ref 30.0–100.0)

## 2023-01-13 LAB — T4, FREE: Free T4: 1.47 ng/dL (ref 0.82–1.77)

## 2023-01-13 LAB — INSULIN, RANDOM: INSULIN: 9.3 u[IU]/mL (ref 2.6–24.9)

## 2023-02-06 ENCOUNTER — Ambulatory Visit (INDEPENDENT_AMBULATORY_CARE_PROVIDER_SITE_OTHER): Payer: Medicare Other

## 2023-02-06 VITALS — BP 138/80 | HR 81 | Ht 72.0 in | Wt 232.0 lb

## 2023-02-06 DIAGNOSIS — Z0000 Encounter for general adult medical examination without abnormal findings: Secondary | ICD-10-CM
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
## 2023-02-06 NOTE — Patient Instructions (Signed)
 [name] , Thank you for taking time to come for your Medicare Wellness Visit. I appreciate your ongoing commitment to your health goals. Please review the following plan we discussed and let me know if I can assist you in the future.   Referrals/Orders/Follow-Ups/Clinician Recommendations: You are due for a Flu vaccine.  It was nice to meet you today and keep up the good work.  This is a list of the screening recommended for you and due dates:  Health Maintenance  Topic Date Due   Flu Shot  09/01/2022   Medicare Annual Wellness Visit  02/06/2024   Colon Cancer Screening  02/24/2024   DTaP/Tdap/Td vaccine (3 - Td or Tdap) 08/31/2025   Pneumonia Vaccine  Completed   Hepatitis C Screening  Completed   Zoster (Shingles) Vaccine  Completed   HPV Vaccine  Aged Out   COVID-19 Vaccine  Discontinued    Advanced directives: (Copy Requested) Please bring a copy of your health care power of attorney and living will to the office to be added to your chart at your convenience.  Next Medicare Annual Wellness Visit scheduled for next year: Yes

## 2023-02-13 ENCOUNTER — Encounter (INDEPENDENT_AMBULATORY_CARE_PROVIDER_SITE_OTHER): Payer: Self-pay | Admitting: Family Medicine

## 2023-02-13 ENCOUNTER — Ambulatory Visit (INDEPENDENT_AMBULATORY_CARE_PROVIDER_SITE_OTHER): Payer: Medicare Other | Admitting: Family Medicine

## 2023-02-13 ENCOUNTER — Ambulatory Visit (INDEPENDENT_AMBULATORY_CARE_PROVIDER_SITE_OTHER): Payer: 59 | Admitting: Family Medicine

## 2023-02-13 VITALS — BP 135/82 | HR 73 | Temp 98.1°F | Ht 71.0 in | Wt 222.0 lb

## 2023-02-13 DIAGNOSIS — E559 Vitamin D deficiency, unspecified: Secondary | ICD-10-CM

## 2023-02-13 DIAGNOSIS — E785 Hyperlipidemia, unspecified: Secondary | ICD-10-CM | POA: Diagnosis not present

## 2023-02-13 DIAGNOSIS — I10 Essential (primary) hypertension: Secondary | ICD-10-CM

## 2023-02-13 DIAGNOSIS — R7303 Prediabetes: Secondary | ICD-10-CM | POA: Diagnosis not present

## 2023-02-13 DIAGNOSIS — E669 Obesity, unspecified: Secondary | ICD-10-CM

## 2023-02-13 DIAGNOSIS — Z6830 Body mass index (BMI) 30.0-30.9, adult: Secondary | ICD-10-CM

## 2023-02-13 MED ORDER — VITAMIN D (ERGOCALCIFEROL) 1.25 MG (50000 UNIT) PO CAPS: 50000.0000 [IU] | ORAL_CAPSULE | ORAL | 0 refills | Status: DC

## 2023-02-13 NOTE — Patient Instructions (Signed)
 The 10-year ASCVD risk score (Arnett DK, et al., 2019) is: 19.9%   Values used to calculate the score:     Age: 68 years     Sex: Male     Is Non-Hispanic African American: No     Diabetic: No     Tobacco smoker: No     Systolic Blood Pressure: 135 mmHg     Is BP treated: Yes     HDL Cholesterol: 38 mg/dL     Total Cholesterol: 178 mg/dL

## 2023-02-13 NOTE — Progress Notes (Signed)
 Maurice Hart, D.O.  ABFM, ABOM Clinical Bariatric Medicine Physician  Office located at: [address] [place], [place]  [address]     Assessment and Plan:   FOR THE DISEASE OF OBESITY: Obesity (BMI 30-39.9) - Starting BMI 30.84 BMI 30.0-30.9,adult - Current 30.98 Assessment & Plan: Since last office visit on 01/12/23 patient's  Muscle mass has increased by 1lb. Fat mass has increased by 0.2lb. Total body water has increased by 2 lb.  Counseling done on how various foods will affect these numbers and how to maximize success  Total lbs lost to date: -1 lbs Total weight loss percentage to date: 0.45 %     Recommended Dietary Goals [name] is currently in the action stage of change. As such, his goal is to continue weight management plan.   Pt is doing very well at journaling. On avg meeting his protein goals of 100+ g of protein daily and calories averaging around 1500-1600.   He has agreed to: continue current plan   Behavioral Intervention We discussed the following Behavioral Modification Strategies today: increasing lean protein intake to established goals, decreasing simple carbohydrates , increasing water intake , decreasing eating out or consumption of processed foods, and making healthy choices when eating convenient foods, continue to work on implementation of reduced calorie nutritional plan, continue to practice mindfulness when eating, and continue to work on maintaining a reduced calorie state, getting the recommended amount of protein, incorporating whole foods, making healthy choices, staying well hydrated and practicing mindfulness when eating.  Additional resources provided today:  Prediabetes and Insulin  resistance handout.   Evidence-based interventions for health behavior change were utilized today including the discussion of self monitoring techniques, problem-solving barriers and SMART goal setting techniques.   Regarding patient's less desirable eating  habits and patterns, we employed the technique of small changes.    Recommended Physical Activity Goals Klein has been advised to work up to 150 minutes of moderate intensity aerobic activity a week and strengthening exercises 2-3 times per week for cardiovascular health, weight loss maintenance and preservation of muscle mass.   He has agreed to :  Continue current level of physical activity    Pharmacotherapy We discussed various medication options to help [place] with his weight loss efforts and we both agreed to : continue with nutritional and behavioral strategies   FOR ASSOCIATED CONDITIONS ADDRESSED TODAY: Prediabetes Assessment & Plan: Lab Results  Component Value Date   HGBA1C 5.9 (H) 01/12/2023   HGBA1C 6.1 08/10/2022   HGBA1C 6.3 09/20/2021   INSULIN  9.3 01/12/2023   [name]'s latest A1C was above goal at 5.9 as of 01/12/23. He reports walking over 3 miles for about 75 minutes a day. Recently got a treadmill. Is drinking over 100 ounces of water daily, uses a water bottle to keep track. Reports a hx of kidney stones due to insufficient water intake previously.   Ideal A1C readings reviewed. Fasting insulin  should be less than 5. I counseled patient on pathophysiology of the disease process of Pre-DM.  Handouts provided after education provided.  All concerns/questions addressed. Drink at least half his body weight in ounces of water daily. Continue to decrease simple carbs/ sugars; increase fiber and proteins -> follow his meal plan in an effort to decrease risk of progressing to diabetes.    Primary hypertension Assessment & Plan: BP Readings from Last 3 Encounters:  02/13/23 135/82  02/06/23 138/80  01/12/23 122/74   BP is at goal today. Pt not  currently on antihypertensives. Currently diet/exercise controlled. Monitors his BP 2-3 times a week. BP averaging 126/85 at home. Reports his systolic BP was elevated one time when seen by his PCP, Olmesartan  was prescribed. Pt  checked his BP at home the next day and decided not to start his antihypertensive after getting normal readings of 116 systolic. Serum creatinine is elevated at 1.30 as of 01/12/23. Pt not taking advil or aleve regularly.   Ideal BP readings were reviewed with pt. Avoid simple carbs/sugars and high sodium foods. Reviewed healthy alternatives to rice. Educated pt on ARBs being protective of the kidneys. Continue to drink sufficient amounts of water (half his body weight in oz) daily. Continue with current regimen, no changes.    Hyperlipidemia LDL goal <130 Assessment & Plan: Lab Results  Component Value Date   CHOL 178 01/12/2023   HDL 38 (L) 01/12/2023   LDLCALC 118 (H) 01/12/2023   TRIG 122 01/12/2023   CHOLHDL 4.7 01/12/2023   The 10-year ASCVD risk score (Arnett DK, et al., 2019) is: 19.9%   Values used to calculate the score:     Age: 15 years     Sex: Male     Is Non-Hispanic African American: No     Diabetic: No     Tobacco smoker: No     Systolic Blood Pressure: 135 mmHg     Is BP treated: Yes     HDL Cholesterol: 38 mg/dL     Total Cholesterol: 178 mg/dL  LDL is elevated and 38 is low. Pt is very hesitant about starting Crestor  given to him by his PCP in July. Pt has opted not to take it. Had a CT cardiac scoring on 10/28/19, results revealed coronary calcium  score of 37 and 43rd percentile for his age/sex. [name] reports a fmhx of cardiovascular disease, his father had heart attack due to blockage from prior injury and grandfather died of stroke at 32-89.   Discussed ASCVD risk thoroughly and addressed all questions. Pt verbalized understanding. I highly recommend he take his Crestor  20 mg and follow up with his PCP. Advised pt to continue with diet/lifestyle changes he has made.    Vitamin D  deficiency- new Assessment & Plan: Lab Results  Component Value Date   VD25OH 27.1 (L) 01/12/2023   Vitamin D  levels were below goal at 27.1 on 01/12/23. Pt has been taking his  wife's OTC 2500 mcg of vitamin D  once daily. Notes his energy levels have improved with vitamin D  supplementation and daily exercise.   Ideal vitamin D  goals of 50+ reviewed with pt. Counseling on how vitamin D  can help improve moods, body aches, and energy levels. I recommend he start 5000 IUs vitamin D  supplementation today. Pt would prefer to start ERGO 50K units once weekly. Moods and energy levels should improve with ERGO. We will continue to monitor his condition as it relates to his weight loss.   Orders: - Start on ERGO today, 50K units once weekly.    FOLLOW UP:   Return in about 2 weeks (around 02/27/2023). He was informed of the importance of frequent follow up visits to maximize his success with intensive lifestyle modifications for his multiple health conditions.   Subjective:   Chief complaint: Obesity Maurice Hart is here to discuss his progress with his obesity treatment plan. He is on the keeping a food journal and adhering to recommended goals of 1450-1550 calories and 100+ g of protein and states he is following his eating plan approximately 98 %  of the time. He states he is walking about 3 miles for 75 minutes 7 days per week. Recently got a treadmill.   Interval History:  DEARION HUOT is here today for his first follow-up office visit since starting the program with us .  Since last office visit he reports some off-plan eating over the holidays, stating he struggles to stay on plan. Has foods such as prime rib, steak, and shrimp boil at Christmas Eve. Had chili for dinner last night. Has been eating more protein, especially for breakfast. Enjoys oats cluster cereal with blueberries.   Reports being 230 on home scale before leaving for his Christmas vacation. On 12/31 he returned from his Christmas vacation and was 222.1 lbs at home scale.   All blood work/ lab tests that were recently ordered by myself or an outside provider were reviewed with patient today per their request.  Extended time was spent counseling him on all new disease processes that were discovered or preexisting ones that are affected by BMI.  he understands that many of these abnormalities will need to monitored regularly along with the current treatment plan of prudent dietary changes, in which we are making each and every office visit, to improve these health parameters.  Pharmacotherapy for weight loss: He is not currently taking medications  for medical weight loss.  Denies side effects.    Review of Systems:  Pertinent positives were addressed with patient today.  Reviewed by clinician on day of visit: allergies, medications, problem list, medical history, surgical history, family history, social history, and previous encounter notes.   Weight Summary and Biometrics   Weight Lost Since Last Visit: 0 lb  Weight Gained Since Last Visit: 1 lb   Vitals Temp: 98.1 F (36.7 C) BP: 135/82 Pulse Rate: 73 SpO2: 99 %   Anthropometric Measurements Height: 5' 11 (1.803 m) Weight: 222 lb (100.7 kg) BMI (Calculated): 30.98 Weight at Last Visit: 221 lb Weight Lost Since Last Visit: 0 lb Weight Gained Since Last Visit: 1 lb Starting Weight: 221 lb Peak Weight: 258 lb   Body Composition  Body Fat %: 31.4 % Fat Mass (lbs): 70 lbs Muscle Mass (lbs): 145.2 lbs Total Body Water (lbs): 101 lbs Visceral Fat Rating : 18   Other Clinical Data RMR: 2045 Fasting: Yes Labs: No Today's Visit #: 2 Starting Date: 01/12/23     Objective:   PHYSICAL EXAM:  Blood pressure 135/82, pulse 73, temperature 98.1 F (36.7 C), height 5' 11 (1.803 m), weight 222 lb (100.7 kg), SpO2 99%. Body mass index is 30.96 kg/m.  General: he is overweight, cooperative and in no acute distress.   HEENT: EOMI, sclerae are anicteric. Lungs: Normal breathing effort, no conversational dyspnea. M-Sk:  Normal gross ROM * 4 extremities  PSYCH: Has normal mood, affect and thought process. Neurologic: No gross  sensory or motor deficits. Well developed, A and O * 3  DIAGNOSTIC DATA REVIEWED:  BMET    Component Value Date/Time   NA 142 01/12/2023 1451   K 4.5 01/12/2023 1451   CL 102 01/12/2023 1451   CO2 23 01/12/2023 1451   GLUCOSE 111 (H) 01/12/2023 1451   GLUCOSE 116 (H) 08/10/2022 0855   BUN 18 01/12/2023 1451   CREATININE 1.30 (H) 01/12/2023 1451   CREATININE 1.19 07/10/2020 0853   CALCIUM  9.6 01/12/2023 1451   GFRNONAA 75 10/22/2018 1523   GFRNONAA 62 09/01/2015 1445   GFRAA 87 10/22/2018 1523   GFRAA 72 09/01/2015 1445  Lab Results  Component Value Date   HGBA1C 5.9 (H) 01/12/2023   HGBA1C 6.4 (H) 09/01/2015   Lab Results  Component Value Date   INSULIN  9.3 01/12/2023   Lab Results  Component Value Date   TSH 2.610 01/12/2023   CBC    Component Value Date/Time   WBC 4.0 01/12/2023 1451   WBC 4.5 08/10/2022 0855   RBC 5.65 01/12/2023 1451   RBC 5.17 08/10/2022 0855   HGB 16.5 01/12/2023 1451   HCT 51.2 (H) 01/12/2023 1451   PLT 183 01/12/2023 1451   MCV 91 01/12/2023 1451   MCH 29.2 01/12/2023 1451   MCH 29.7 07/10/2020 0853   MCHC 32.2 01/12/2023 1451   MCHC 33.3 08/10/2022 0855   RDW 11.9 01/12/2023 1451   Iron Studies No results found for: IRON, TIBC, FERRITIN, IRONPCTSAT Lipid Panel     Component Value Date/Time   CHOL 178 01/12/2023 1451   TRIG 122 01/12/2023 1451   HDL 38 (L) 01/12/2023 1451   CHOLHDL 4.7 01/12/2023 1451   CHOLHDL 5 08/10/2022 0855   VLDL 22.8 08/10/2022 0855   LDLCALC 118 (H) 01/12/2023 1451   LDLCALC 121 (H) 07/10/2020 0853   Hepatic Function Panel     Component Value Date/Time   PROT 7.2 01/12/2023 1451   ALBUMIN 4.7 01/12/2023 1451   AST 22 01/12/2023 1451   ALT 16 01/12/2023 1451   ALKPHOS 62 01/12/2023 1451   BILITOT 0.6 01/12/2023 1451   BILIDIR 0.2 08/10/2022 0855      Component Value Date/Time   TSH 2.610 01/12/2023 1451   Nutritional Lab Results  Component Value Date   VD25OH 27.1 (L)  01/12/2023    Attestations:   Reviewed by clinician on day of visit: allergies, medications, problem list, medical history, surgical history, family history, social history, and previous encounter notes pertinent to patient's obesity diagnosis.  I, Vernell Forest, acting as a medical scribe for Maurice Jenkins, DO., have compiled all relevant documentation for today's office visit on behalf of Maurice Jenkins, DO, while in the presence of [employer], DO.  I have reviewed the above documentation for accuracy and completeness, and I agree with the above. Maurice JINNY Hart, D.O.  The 21st Century Cures Act was signed into law in 2016 which includes the topic of electronic health records.  This provides immediate access to information in MyChart.  This includes consultation notes, operative notes, office notes, lab results and pathology reports.  If you have any questions about what you read please let us  know at your next visit so we can discuss your concerns and take corrective action if need be.  We are right here with you.

## 2023-02-27 ENCOUNTER — Encounter (INDEPENDENT_AMBULATORY_CARE_PROVIDER_SITE_OTHER): Payer: Self-pay | Admitting: Family Medicine

## 2023-02-27 ENCOUNTER — Ambulatory Visit (INDEPENDENT_AMBULATORY_CARE_PROVIDER_SITE_OTHER): Payer: Medicare Other | Admitting: Family Medicine

## 2023-02-27 ENCOUNTER — Ambulatory Visit (INDEPENDENT_AMBULATORY_CARE_PROVIDER_SITE_OTHER): Payer: 59 | Admitting: Family Medicine

## 2023-02-27 VITALS — BP 135/80 | HR 78 | Temp 97.8°F | Ht 71.0 in | Wt 218.0 lb

## 2023-02-27 DIAGNOSIS — I10 Essential (primary) hypertension: Secondary | ICD-10-CM | POA: Diagnosis not present

## 2023-02-27 DIAGNOSIS — E785 Hyperlipidemia, unspecified: Secondary | ICD-10-CM

## 2023-02-27 DIAGNOSIS — Z6830 Body mass index (BMI) 30.0-30.9, adult: Secondary | ICD-10-CM

## 2023-02-27 DIAGNOSIS — R7303 Prediabetes: Secondary | ICD-10-CM

## 2023-02-27 DIAGNOSIS — E559 Vitamin D deficiency, unspecified: Secondary | ICD-10-CM

## 2023-02-27 DIAGNOSIS — E669 Obesity, unspecified: Secondary | ICD-10-CM

## 2023-02-27 MED ORDER — VITAMIN D (ERGOCALCIFEROL) 1.25 MG (50000 UNIT) PO CAPS: 50000.0000 [IU] | ORAL_CAPSULE | ORAL | 0 refills | Status: DC

## 2023-02-27 NOTE — Progress Notes (Signed)
Carlye Grippe, D.O.  ABFM, ABOM Specializing in Clinical Bariatric Medicine  Office located at: [address] [place], [place]  [address]   Assessment and Plan:   Medications Discontinued During This Encounter  Medication Reason   Vitamin D, Ergocalciferol, (DRISDOL) 1.25 MG (50000 UNIT) CAPS capsule Reorder     Meds ordered this encounter  Medications   Vitamin D, Ergocalciferol, (DRISDOL) 1.25 MG (50000 UNIT) CAPS capsule    Sig: Take 1 capsule (50,000 Units total) by mouth every 7 (seven) days.    Dispense:  4 capsule    Refill:  0     FOR THE DISEASE OF OBESITY: BMI 30.0-30.9,adult - Current 30.42 Obesity (BMI 30-39.9) - Starting BMI 30.84 Assessment & Plan: Since last office visit on 02/13/23 patient's  muscle mass has decreased by 2lb. Fat mass has decreased by 2.4lb. Total body water has increased by 0.2lb.  Counseling done on how various foods will affect these numbers and how to maximize success  Total lbs lost to date: 3 lbs Total weight loss percentage to date: -1.36%   Recommended Dietary Goals Maurice Hart is currently in the action stage of change. As such, his goal is to continue weight management plan.  He has agreed to: continue current plan   Behavioral Intervention We discussed the following today: increasing lean protein intake to established goals, decreasing simple carbohydrates , increasing water intake , keeping healthy foods at home, and continue to work on maintaining a reduced calorie state, getting the recommended amount of protein, incorporating whole foods, making healthy choices, staying well hydrated and practicing mindfulness when eating.  Additional resources provided today:   journaling log  Evidence-based interventions for health behavior change were utilized today including the discussion of self monitoring techniques, problem-solving barriers and SMART goal setting techniques.   Regarding patient's less desirable eating habits and  patterns, we employed the technique of small changes.    Pt will specifically work on: Continue to follow his meal plan and exercise regularly for next visit.    Recommended Physical Activity Goals Maurice Hart has been advised to work up to 150 minutes of moderate intensity aerobic activity a week and strengthening exercises 2-3 times per week for cardiovascular health, weight loss maintenance and preservation of muscle mass.   He has agreed to :  Continue current level of physical activity , Think about enjoyable ways to increase daily physical activity and overcoming barriers to exercise, and Increase physical activity in their day and reduce sedentary time (increase NEAT).   Pharmacotherapy We discussed various medication options to help Maurice Hart with his weight loss efforts and we both agreed to : continue with nutritional and behavioral strategies   FOR ASSOCIATED CONDITIONS ADDRESSED TODAY:  Prediabetes Assessment & Plan: Lab Results  Component Value Date   HGBA1C 5.9 (H) 01/12/2023   HGBA1C 6.1 08/10/2022   HGBA1C 6.3 09/20/2021   INSULIN 9.3 01/12/2023    Last A1C was 01/12/23. Not on any medications. Diet/exercise approach. He is highly motivated to continue eating on plan and exercising regularly. Drinks 110-120 ounces of water. Pt reports occasional off-plan eating, but tends to still measure these foods when eating.   Discussed occasional off-plan being okay as long as it is not done on a regular basis and limited portions. Reviewed alternative sources of protein when wanting to snack (peanut butter low calorie protein powder). Encouraged pt to continue his weight loss efforts.    Primary hypertension- diet controlled Assessment & Plan: BP Readings  from Last 3 Encounters:  02/27/23 135/80  02/13/23 135/82  02/06/23 138/80   BP is at goal. Pt not taking Benicar. Not on any other antihypertensives. Diet/exercise controlled. No acute concerns today. Has been eating on plan  regularly as well as exercising at least 6 days a week (walking and weights).   Continue with current regimen by following his meal plan and exercising regularly. Will monitor his condition as it relates to his weight loss journey.    Dyslipidemia (high LDL; low HDL) Assessment & Plan: Lab Results  Component Value Date   CHOL 178 01/12/2023   HDL 38 (L) 01/12/2023   LDLCALC 118 (H) 01/12/2023   TRIG 122 01/12/2023   CHOLHDL 4.7 01/12/2023   Pt stopped taking Crestor and started 1200 mg of Red Yeast Rice after dinner on 02/15/23. No intolerances or side effects reported today. Has been following his meal plan and exercising regularly (walking and weight training).   Pt encouraged to continue his exercise regimen and following a heart healthy diet via his nutritional meal plan. Continue with Red Yeast Rice. Will continue to monitor his condition as it pertains to his efforts in weight loss and overall health.    Vitamin D deficiency Assessment & Plan: Lab Results  Component Value Date   VD25OH 27.1 (L) 01/12/2023   Last vit D was 27.1, below goal. Was started on ERGO once per week at last visit on 02/13/23. Is compliant with ERGO once a week, taking Friday mornings. Tolerating well, no SE. Has stopped taking OTC vitamin D supplementation. No acute concerns today.   Continue with current regimen as prescribed, no dose changes. Will recheck in 3-4 months from last checked.   Orders: - Refill ERGO, no changes made today   Follow up:   Return in about 2 weeks (around 03/13/2023). He was informed of the importance of frequent follow up visits to maximize his success with intensive lifestyle modifications for his multiple health conditions.  Subjective:   Chief complaint: Obesity Maurice Hart is here to discuss his progress with his obesity treatment plan. He is on the keeping a food journal and adhering to recommended goals of 1450-1550 calories and 100+ g of  protein and states he is  following his eating plan approximately 95% of the time. He states he is walking 90 minutes 6 days per week, stationary bike for 20 minutes and weight training (bench presses/biceps) for 30 minutes. Has been exercising for stress management.  Interval History:  Maurice Hart is here for a follow up office visit. Since last OV, he is down 4 lbs. He has been eating foods that are filling and satisfying, enjoys eating foods on his meal plan. Is tracking a daily average of 1532 calories and 120-160 g of protein on My[employer]itness[employer]. He reports a couple of days that he did not meet his protein goals, had 76 g of protein. For breakfast he alternates between foods such as 2 eggs, cottage cheese, high protein cereals, and [place] bacon.   Barriers identified: none.   Pharmacotherapy for weight loss: He is currently taking no anti-obesity medication.   Review of Systems:  Pertinent positives were addressed with patient today.  Reviewed by clinician on day of visit: allergies, medications, problem list, medical history, surgical history, family history, social history, and previous encounter notes.  Weight Summary and Biometrics   Weight Lost Since Last Visit: 4 lb  Weight Gained Since Last Visit: 0 lb    Vitals Temp: 97.8 F (36.6  C) BP: 135/80 Pulse Rate: 78 SpO2: 99 %   Anthropometric Measurements Height: 5\' 11"  (1.803 m) Weight: 218 lb (98.9 kg) BMI (Calculated): 30.42 Weight at Last Visit: 222 lb Weight Lost Since Last Visit: 4 lb Weight Gained Since Last Visit: 0 lb Starting Weight: 221 lb Total Weight Loss (lbs): 3 lb (1.361 kg) Peak Weight: 258 lb   Body Composition  Body Fat %: 31 % Fat Mass (lbs): 67.6 lbs Muscle Mass (lbs): 143.2 lbs Total Body Water (lbs): 101.2 lbs Visceral Fat Rating : 18   Other Clinical Data RMR: 2045 Fasting: No Labs: No Today's Visit #: 3 Starting Date: 01/10/23    Objective:   PHYSICAL EXAM: Blood pressure 135/80, pulse 78, temperature  97.8 F (36.6 C), height 5\' 11"  (1.803 m), weight 218 lb (98.9 kg), SpO2 99%. Body mass index is 30.4 kg/m.  General: he is overweight, cooperative and in no acute distress. PSYCH: Has normal mood, affect and thought process.   HEENT: EOMI, sclerae are anicteric. Lungs: Normal breathing effort, no conversational dyspnea. Extremities: Moves * 4 Neurologic: A and O * 3, good insight  DIAGNOSTIC DATA REVIEWED: BMET    Component Value Date/Time   NA 142 01/12/2023 1451   K 4.5 01/12/2023 1451   CL 102 01/12/2023 1451   CO2 23 01/12/2023 1451   GLUCOSE 111 (H) 01/12/2023 1451   GLUCOSE 116 (H) 08/10/2022 0855   BUN 18 01/12/2023 1451   CREATININE 1.30 (H) 01/12/2023 1451   CREATININE 1.19 07/10/2020 0853   CALCIUM 9.6 01/12/2023 1451   GFRNONAA 75 10/22/2018 1523   GFRNONAA 62 09/01/2015 1445   GFRAA 87 10/22/2018 1523   GFRAA 72 09/01/2015 1445   Lab Results  Component Value Date   HGBA1C 5.9 (H) 01/12/2023   HGBA1C 6.4 (H) 09/01/2015   Lab Results  Component Value Date   INSULIN 9.3 01/12/2023   Lab Results  Component Value Date   TSH 2.610 01/12/2023   CBC    Component Value Date/Time   WBC 4.0 01/12/2023 1451   WBC 4.5 08/10/2022 0855   RBC 5.65 01/12/2023 1451   RBC 5.17 08/10/2022 0855   HGB 16.5 01/12/2023 1451   HCT 51.2 (H) 01/12/2023 1451   PLT 183 01/12/2023 1451   MCV 91 01/12/2023 1451   MCH 29.2 01/12/2023 1451   MCH 29.7 07/10/2020 0853   MCHC 32.2 01/12/2023 1451   MCHC 33.3 08/10/2022 0855   RDW 11.9 01/12/2023 1451   Iron Studies No results found for: "IRON", "TIBC", "FERRITIN", "IRONPCTSAT" Lipid Panel     Component Value Date/Time   CHOL 178 01/12/2023 1451   TRIG 122 01/12/2023 1451   HDL 38 (L) 01/12/2023 1451   CHOLHDL 4.7 01/12/2023 1451   CHOLHDL 5 08/10/2022 0855   VLDL 22.8 08/10/2022 0855   LDLCALC 118 (H) 01/12/2023 1451   LDLCALC 121 (H) 07/10/2020 0853   Hepatic Function Panel     Component Value Date/Time   PROT  7.2 01/12/2023 1451   ALBUMIN 4.7 01/12/2023 1451   AST 22 01/12/2023 1451   ALT 16 01/12/2023 1451   ALKPHOS 62 01/12/2023 1451   BILITOT 0.6 01/12/2023 1451   BILIDIR 0.2 08/10/2022 0855      Component Value Date/Time   TSH 2.610 01/12/2023 1451   Nutritional Lab Results  Component Value Date   VD25OH 27.1 (L) 01/12/2023    Attestations:   Burnett Sheng, acting as a medical scribe for Thomasene Lot, DO., have  compiled all relevant documentation for today's office visit on behalf of Thomasene Lot, DO, while in the presence of [employer], DO.  Reviewed by clinician on day of visit: allergies, medications, problem list, medical history, surgical history, family history, social history, and previous encounter notes pertinent to patient's obesity diagnosis.  I have reviewed the above documentation for accuracy and completeness, and I agree with the above. Carlye Grippe, D.O.  The 21st Century Cures Act was signed into law in 2016 which includes the topic of electronic health records.  This provides immediate access to information in MyChart.  This includes consultation notes, operative notes, office notes, lab results and pathology reports.  If you have any questions about what you read please let us know at your next visit so we can discuss your concerns and take corrective action if need be.  We are right here with you.

## 2023-03-13 ENCOUNTER — Encounter (INDEPENDENT_AMBULATORY_CARE_PROVIDER_SITE_OTHER): Payer: Self-pay | Admitting: Family Medicine

## 2023-03-13 ENCOUNTER — Ambulatory Visit (INDEPENDENT_AMBULATORY_CARE_PROVIDER_SITE_OTHER): Payer: Medicare Other | Admitting: Family Medicine

## 2023-03-13 VITALS — BP 125/77 | HR 64 | Temp 97.9°F | Ht 71.0 in | Wt 212.0 lb

## 2023-03-13 DIAGNOSIS — E559 Vitamin D deficiency, unspecified: Secondary | ICD-10-CM

## 2023-03-13 DIAGNOSIS — Z6829 Body mass index (BMI) 29.0-29.9, adult: Secondary | ICD-10-CM

## 2023-03-13 DIAGNOSIS — E65 Localized adiposity: Secondary | ICD-10-CM

## 2023-03-13 DIAGNOSIS — E669 Obesity, unspecified: Secondary | ICD-10-CM | POA: Diagnosis not present

## 2023-03-13 DIAGNOSIS — Z6830 Body mass index (BMI) 30.0-30.9, adult: Secondary | ICD-10-CM

## 2023-03-13 DIAGNOSIS — R7303 Prediabetes: Secondary | ICD-10-CM

## 2023-03-13 MED ORDER — VITAMIN D (ERGOCALCIFEROL) 1.25 MG (50000 UNIT) PO CAPS: 50000.0000 [IU] | ORAL_CAPSULE | ORAL | 0 refills | Status: DC

## 2023-03-13 NOTE — Progress Notes (Signed)
Carlye Grippe, D.O.  ABFM, ABOM Specializing in Clinical Bariatric Medicine  Office located at: [address] [place], [place]  [address]   Assessment and Plan:   FOR THE DISEASE OF OBESITY: BMI 30.0-30.9,adult - Current 29.58 Obesity (BMI 30-39.9) - Starting BMI 30.84 Assessment & Plan: Since last office visit on 02/27/2023 patient's  Muscle mass has decreased by 2.6 lb. Fat mass has decreased by 3 lb. Total body water has decreased by 1.8 lb.  Counseling done on how various foods will affect these numbers and how to maximize success. Also discussed goal body percentage.   Total lbs lost to date: 9 lbs  Total weight loss percentage to date: 4.07%    Recommended Dietary Goals Maurice Hart is currently in the action stage of change. As such, his goal is to continue weight management plan.  We modified his journal parameters to 1450-1550 calories and 115++ g of protein due to his current level of physical activity.     Behavioral Intervention We discussed the following today: increasing lean protein intake to established goals and continue to work on implementation of reduced calorie nutritional plan  Additional resources provided today: None  Evidence-based interventions for health behavior change were utilized today including the discussion of self monitoring techniques, problem-solving barriers and SMART goal setting techniques.   Regarding patient's less desirable eating habits and patterns, we employed the technique of small changes.   Pt will specifically work on: n/a    Recommended Physical Activity Goals Maurice Hart has been advised to work up to 150 minutes of moderate intensity aerobic activity a week and strengthening exercises 2-3 times per week for cardiovascular health, weight loss maintenance and preservation of muscle mass.   He has agreed to : Continue current level of physical activity    Pharmacotherapy We both agreed to : continue with nutritional and  behavioral strategies   FOR ASSOCIATED CONDITIONS ADDRESSED TODAY:  Visceral obesity Assessment & Plan: Current visceral fat rating: 17. The visceral fat rating should < 10 in a male.   Visceral adipose tissue is a hormonally active component of total body fat. This body composition phenotype is associated with medical disorders such as metabolic syndrome, cardiovascular disease and several malignancies including prostate, breast, and colorectal cancers. Goal: Lose 7-10% of weight via prudent nutritional plan and lifestyle changes.     Prediabetes Assessment & Plan: No current meds. Diet/exercise approach. His hunger and cravings are well controlled when following his prudent nutritional plan. Continue working on nutrition plan to decrease simple carbohydrates, increase lean proteins and exercise to promote weight loss and improve glycemic control and prevent progression to diabetes mellitus.   Vitamin D deficiency Assessment & Plan: Pt is on prescription high dose vitamin D once weekly without any complications. Continue vitamin D supplementation - recheck levels in the future.  Orders:  -     Vitamin D (Ergocalciferol); Take 1 capsule (50,000 Units total) by mouth every 7 (seven) days.  Dispense: 4 capsule; Refill: 0   Follow up:   Return 04/03/2023. He was informed of the importance of frequent follow up visits to maximize his success with intensive lifestyle modifications for his multiple health conditions.  Subjective:   Chief complaint: Obesity Maurice Hart is here to discuss his progress with his obesity treatment plan. He is  keeping a food journal and adhering to recommended goals of 1450-1550 calories and 100+ g of protein and states he is following his eating plan approximately 100% of the time. He  states he is walking/doing weights 90  minutes 6 days per week.  Interval History:  Maurice Hart is here for a follow up office visit. Since last OV on 02/27/2023, Maurice Hart is down 6  lbs. Pt is consistently journaling his intake. He is doing well with his protein. He has 7-8 ounces of lean protein at night. Sometimes he is over in his calorie range by 100 or 200 calories however since he is exercising a lot this should be not problematic. His hunger and cravings are well controlled. His BP is well controlled in clinic and at home.   Pharmacotherapy for weight loss: He is currently taking no anti-obesity medication.   Review of Systems:  Pertinent positives were addressed with patient today.  Reviewed by clinician on day of visit: allergies, medications, problem list, medical history, surgical history, family history, social history, and previous encounter notes.  Weight Summary and Biometrics   Weight Lost Since Last Visit: 6 lb  Weight Gained Since Last Visit: 0 lb   Vitals Temp: 97.9 F (36.6 C) BP: 125/77 Pulse Rate: 64 SpO2: 99 %   Anthropometric Measurements Height: 5\' 11"  (1.803 m) Weight: 212 lb (96.2 kg) BMI (Calculated): 29.58 Weight at Last Visit: 218 lb Weight Lost Since Last Visit: 6 lb Weight Gained Since Last Visit: 0 lb Starting Weight: 221 lb Total Weight Loss (lbs): 9 lb (4.082 kg) Peak Weight: 258 lb   Body Composition  Body Fat %: 30.4 % Fat Mass (lbs): 64.6 lbs Muscle Mass (lbs): 140.6 lbs Total Body Water (lbs): 99.4 lbs Visceral Fat Rating : 17   Other Clinical Data RMR: 2045 Fasting: No Labs: No Today's Visit #: 4 Starting Date: 01/10/23   Objective:   PHYSICAL EXAM: Blood pressure 125/77, pulse 64, temperature 97.9 F (36.6 C), height 5\' 11"  (1.803 m), weight 212 lb (96.2 kg), SpO2 99%. Body mass index is 29.57 kg/m.  General: he is overweight, cooperative and in no acute distress. PSYCH: Has normal mood, affect and thought process.   HEENT: EOMI, sclerae are anicteric. Lungs: Normal breathing effort, no conversational dyspnea. Extremities: Moves * 4 Neurologic: A and O * 3, good insight  DIAGNOSTIC DATA  REVIEWED: BMET    Component Value Date/Time   NA 142 01/12/2023 1451   K 4.5 01/12/2023 1451   CL 102 01/12/2023 1451   CO2 23 01/12/2023 1451   GLUCOSE 111 (H) 01/12/2023 1451   GLUCOSE 116 (H) 08/10/2022 0855   BUN 18 01/12/2023 1451   CREATININE 1.30 (H) 01/12/2023 1451   CREATININE 1.19 07/10/2020 0853   CALCIUM 9.6 01/12/2023 1451   GFRNONAA 75 10/22/2018 1523   GFRNONAA 62 09/01/2015 1445   GFRAA 87 10/22/2018 1523   GFRAA 72 09/01/2015 1445   Lab Results  Component Value Date   HGBA1C 5.9 (H) 01/12/2023   HGBA1C 6.4 (H) 09/01/2015   Lab Results  Component Value Date   INSULIN 9.3 01/12/2023   Lab Results  Component Value Date   TSH 2.610 01/12/2023   CBC    Component Value Date/Time   WBC 4.0 01/12/2023 1451   WBC 4.5 08/10/2022 0855   RBC 5.65 01/12/2023 1451   RBC 5.17 08/10/2022 0855   HGB 16.5 01/12/2023 1451   HCT 51.2 (H) 01/12/2023 1451   PLT 183 01/12/2023 1451   MCV 91 01/12/2023 1451   MCH 29.2 01/12/2023 1451   MCH 29.7 07/10/2020 0853   MCHC 32.2 01/12/2023 1451   MCHC 33.3 08/10/2022 0855  RDW 11.9 01/12/2023 1451   Iron Studies No results found for: "IRON", "TIBC", "FERRITIN", "IRONPCTSAT" Lipid Panel     Component Value Date/Time   CHOL 178 01/12/2023 1451   TRIG 122 01/12/2023 1451   HDL 38 (L) 01/12/2023 1451   CHOLHDL 4.7 01/12/2023 1451   CHOLHDL 5 08/10/2022 0855   VLDL 22.8 08/10/2022 0855   LDLCALC 118 (H) 01/12/2023 1451   LDLCALC 121 (H) 07/10/2020 0853   Hepatic Function Panel     Component Value Date/Time   PROT 7.2 01/12/2023 1451   ALBUMIN 4.7 01/12/2023 1451   AST 22 01/12/2023 1451   ALT 16 01/12/2023 1451   ALKPHOS 62 01/12/2023 1451   BILITOT 0.6 01/12/2023 1451   BILIDIR 0.2 08/10/2022 0855      Component Value Date/Time   TSH 2.610 01/12/2023 1451   Nutritional Lab Results  Component Value Date   VD25OH 27.1 (L) 01/12/2023    Attestations:   I, Special Puri, acting as a Stage manager for  Thomasene Lot, DO., have compiled all relevant documentation for today's office visit on behalf of Thomasene Lot, DO, while in the presence of [employer], DO.  I have reviewed the above documentation for accuracy and completeness, and I agree with the above. Carlye Grippe, D.O.  The 21st Century Cures Act was signed into law in 2016 which includes the topic of electronic health records.  This provides immediate access to information in MyChart.  This includes consultation notes, operative notes, office notes, lab results and pathology reports.  If you have any questions about what you read please let us know at your next visit so we can discuss your concerns and take corrective action if need be.  We are right here with you.

## 2023-04-03 ENCOUNTER — Ambulatory Visit (INDEPENDENT_AMBULATORY_CARE_PROVIDER_SITE_OTHER): Payer: Medicare Other | Admitting: Family Medicine

## 2023-04-03 ENCOUNTER — Encounter (INDEPENDENT_AMBULATORY_CARE_PROVIDER_SITE_OTHER): Payer: Self-pay | Admitting: Family Medicine

## 2023-04-03 VITALS — BP 129/75 | HR 73 | Temp 97.9°F | Ht 71.0 in | Wt 210.0 lb

## 2023-04-03 DIAGNOSIS — E669 Obesity, unspecified: Secondary | ICD-10-CM | POA: Diagnosis not present

## 2023-04-03 DIAGNOSIS — E559 Vitamin D deficiency, unspecified: Secondary | ICD-10-CM

## 2023-04-03 DIAGNOSIS — Z6830 Body mass index (BMI) 30.0-30.9, adult: Secondary | ICD-10-CM

## 2023-04-03 DIAGNOSIS — Z6829 Body mass index (BMI) 29.0-29.9, adult: Secondary | ICD-10-CM | POA: Diagnosis not present

## 2023-04-03 DIAGNOSIS — R7303 Prediabetes: Secondary | ICD-10-CM

## 2023-04-03 MED ORDER — VITAMIN D (ERGOCALCIFEROL) 1.25 MG (50000 UNIT) PO CAPS
50000.0000 [IU] | ORAL_CAPSULE | ORAL | 0 refills | Status: DC
Start: 2023-04-03 — End: 2023-05-02

## 2023-04-03 NOTE — Progress Notes (Signed)
 Maurice Hart, D.O.  ABFM, ABOM Specializing in Clinical Bariatric Medicine  Office located at: [address] [place], [place]  [address]   Assessment and Plan:   Will obtain fasting labs at next OV -- FLP, BMP with GFR, A1C, fasting insulin, Vit D   FOR THE DISEASE OF OBESITY: BMI 30.0-30.9,adult - Current BMI 29.3 Obesity (BMI 30-39.9) - Starting BMI 30.84 Assessment & Plan: Since last office visit on 03/13/23 patient's muscle mass has increased by 0.6 lb. Fat mass has decreased by 2.4 lb. Total body water has decreased by 3.4 lb.  Counseling done on how various foods will affect these numbers and how to maximize success  Total lbs lost to date: 11 lbs Total weight loss percentage to date: -4.98%    Recommended Dietary Goals Maurice Hart is currently in the action stage of change. As such, his goal is to continue weight management plan.  He has agreed to: continue current plan --> (Journaling 1450-1550 calories and 115+++ g of protein per day).   Prioritize protein intake by eating 30 g from breakfast, 40 grams at lunch, and 50 grams for dinner.    Behavioral Intervention We discussed the following today: increasing lean protein intake to established goals and increasing water intake   Additional resources provided today: None  Evidence-based interventions for health behavior change were utilized today including the discussion of self monitoring techniques, problem-solving barriers and SMART goal setting techniques.   Regarding patient's less desirable eating habits and patterns, we employed the technique of small changes.   Pt will specifically work on: Increase weight lifting and resistance training for next visit.    Recommended Physical Activity Goals Maurice Hart has been advised to work up to 150 minutes of moderate intensity aerobic activity a week and strengthening exercises 2-3 times per week for cardiovascular health, weight loss maintenance and preservation of muscle  mass.   He has agreed to :  Continue current level of physical activity  and Increase the intensity, frequency or duration of strengthening exercises , and when weight lifting pt will increase the weights and decrease the reps. Only increase the weights enough to make the last couple reps a challenge while still avoiding injury.    Pharmacotherapy We both agreed to: continue with nutritional and behavioral strategies   FOR ASSOCIATED CONDITIONS ADDRESSED TODAY: Vitamin D deficiency Assessment & Plan: Lab Results  Component Value Date   VD25OH 27.1 (L) 01/12/2023  Pt is on ERGO 50K units once weekly. Good compliance and tolerance reported. No adverse side effects. No acute concerns in this regard today.   Continue with current supplementation as prescribed. Will refill ERGO, no dosages changes made today. Will continue to monitor condition as it relates to his weight loss.   Orders: - Refill ERGO, no dose changes.   Prediabetes Assessment & Plan: Lab Results  Component Value Date   HGBA1C 5.9 (H) 01/12/2023   HGBA1C 6.1 08/10/2022   HGBA1C 6.3 09/20/2021   INSULIN 9.3 01/12/2023  No meds currently. Diet/exercise approach. Has been exercising daily and is overall adhering to his meal plan and his daily calorie/protein intake goals.   Continue to work on following his meal plan and adhering to daily calorie/protein parameters. Prioritize protein intake by eating 30 g from breakfast, 40 grams at lunch, and 50 grams for dinner. Encouraged pt to increase weights and decrease reps when weight lifting in an effort to build muscle mass and lose fat mass. Will continue to monitor condition.  Follow up:   Return in about 3 weeks (around 04/24/2023) for 3-4 week follow up , fasting labs. He was informed of the importance of frequent follow up visits to maximize his success with intensive lifestyle modifications for his multiple health conditions.  Subjective:   Chief complaint:  Obesity Maurice Hart is here to discuss his progress with his obesity treatment plan. He is keeping a food journal and adhering to recommended goals of 1450-1550 calories and 115++ g of protein and states he is following his eating plan approximately 90% of the time. He states he is walking 90 minutes 7 days per week. Also doing weight lifting with barbells doing 3-4 reps of 15-20 per day.   Interval History:  Maurice Hart is here for a follow up office visit. Since last OV on 03/13/2023, he is down 2 lbs. He recently hosted family from out of town and occasionally ate outside the home, but does note that he tried to stay on plan when doing so. He is journaling daily and brought his log with him today. Daily caloric intake ranges from 1475 to 1687 and 98-127 grams of protein per day. He only went over his caloric intake goal 5 days in the past 2 weeks. He has been drinking 116-120 ounces of water per day. He adds that he has been using a motivation app that has helped him stay on track with his diet and exercise goals and motivation on a daily basis. The app also tracks the miles he walks per day.   Pharmacotherapy for weight loss: He is currently taking no anti-obesity medication.   Review of Systems:  Pertinent positives were addressed with patient today.  Reviewed by clinician on day of visit: allergies, medications, problem list, medical history, surgical history, family history, social history, and previous encounter notes.  Weight Summary and Biometrics   Weight Lost Since Last Visit: 2 lb  Weight Gained Since Last Visit: 0    Vitals Temp: 97.9 F (36.6 C) BP: 129/75 Pulse Rate: 73 SpO2: 100 %   Anthropometric Measurements Height: 5\' 11"  (1.803 m) Weight: 210 lb (95.3 kg) BMI (Calculated): 29.3 Weight at Last Visit: 212 lb Weight Lost Since Last Visit: 2 lb Weight Gained Since Last Visit: 0 Starting Weight: 221 lb Total Weight Loss (lbs): 11 lb (4.99 kg) Peak Weight: 258  lb   Body Composition  Body Fat %: 29.5 % Fat Mass (lbs): 62.2 lbs Muscle Mass (lbs): 141.2 lbs Total Body Water (lbs): 96 lbs Visceral Fat Rating : 17   Other Clinical Data Fasting: No Labs: No Today's Visit #: 5 Starting Date: 01/10/23    Objective:   PHYSICAL EXAM: Blood pressure 129/75, pulse 73, temperature 97.9 F (36.6 C), height 5\' 11"  (1.803 m), weight 210 lb (95.3 kg), SpO2 100%. Body mass index is 29.29 kg/m.  General: he is overweight, cooperative and in no acute distress. PSYCH: Has normal mood, affect and thought process.   HEENT: EOMI, sclerae are anicteric. Lungs: Normal breathing effort, no conversational dyspnea. Extremities: Moves * 4 Neurologic: A and O * 3, good insight  DIAGNOSTIC DATA REVIEWED: BMET    Component Value Date/Time   NA 142 01/12/2023 1451   K 4.5 01/12/2023 1451   CL 102 01/12/2023 1451   CO2 23 01/12/2023 1451   GLUCOSE 111 (H) 01/12/2023 1451   GLUCOSE 116 (H) 08/10/2022 0855   BUN 18 01/12/2023 1451   CREATININE 1.30 (H) 01/12/2023 1451   CREATININE 1.19 07/10/2020 0853  CALCIUM 9.6 01/12/2023 1451   GFRNONAA 75 10/22/2018 1523   GFRNONAA 62 09/01/2015 1445   GFRAA 87 10/22/2018 1523   GFRAA 72 09/01/2015 1445   Lab Results  Component Value Date   HGBA1C 5.9 (H) 01/12/2023   HGBA1C 6.4 (H) 09/01/2015   Lab Results  Component Value Date   INSULIN 9.3 01/12/2023   Lab Results  Component Value Date   TSH 2.610 01/12/2023   CBC    Component Value Date/Time   WBC 4.0 01/12/2023 1451   WBC 4.5 08/10/2022 0855   RBC 5.65 01/12/2023 1451   RBC 5.17 08/10/2022 0855   HGB 16.5 01/12/2023 1451   HCT 51.2 (H) 01/12/2023 1451   PLT 183 01/12/2023 1451   MCV 91 01/12/2023 1451   MCH 29.2 01/12/2023 1451   MCH 29.7 07/10/2020 0853   MCHC 32.2 01/12/2023 1451   MCHC 33.3 08/10/2022 0855   RDW 11.9 01/12/2023 1451   Iron Studies No results found for: "IRON", "TIBC", "FERRITIN", "IRONPCTSAT" Lipid Panel      Component Value Date/Time   CHOL 178 01/12/2023 1451   TRIG 122 01/12/2023 1451   HDL 38 (L) 01/12/2023 1451   CHOLHDL 4.7 01/12/2023 1451   CHOLHDL 5 08/10/2022 0855   VLDL 22.8 08/10/2022 0855   LDLCALC 118 (H) 01/12/2023 1451   LDLCALC 121 (H) 07/10/2020 0853   Hepatic Function Panel     Component Value Date/Time   PROT 7.2 01/12/2023 1451   ALBUMIN 4.7 01/12/2023 1451   AST 22 01/12/2023 1451   ALT 16 01/12/2023 1451   ALKPHOS 62 01/12/2023 1451   BILITOT 0.6 01/12/2023 1451   BILIDIR 0.2 08/10/2022 0855      Component Value Date/Time   TSH 2.610 01/12/2023 1451   Nutritional Lab Results  Component Value Date   VD25OH 27.1 (L) 01/12/2023    Attestations:   Burnett Sheng, acting as a medical scribe for Thomasene Lot, DO., have compiled all relevant documentation for today's office visit on behalf of Thomasene Lot, DO, while in the presence of [employer], DO.  Reviewed by clinician on day of visit: allergies, medications, problem list, medical history, surgical history, family history, social history, and previous encounter notes pertinent to patient's obesity diagnosis.  I have reviewed the above documentation for accuracy and completeness, and I agree with the above. Maurice Hart, D.O.  The 21st Century Cures Act was signed into law in 2016 which includes the topic of electronic health records.  This provides immediate access to information in MyChart.  This includes consultation notes, operative notes, office notes, lab results and pathology reports.  If you have any questions about what you read please let us know at your next visit so we can discuss your concerns and take corrective action if need be.  We are right here with you.

## 2023-04-27 ENCOUNTER — Other Ambulatory Visit (INDEPENDENT_AMBULATORY_CARE_PROVIDER_SITE_OTHER): Payer: Self-pay | Admitting: Family Medicine

## 2023-04-27 DIAGNOSIS — E559 Vitamin D deficiency, unspecified: Secondary | ICD-10-CM

## 2023-05-01 DIAGNOSIS — K080 Exfoliation of teeth due to systemic causes: Secondary | ICD-10-CM | POA: Diagnosis not present

## 2023-05-02 ENCOUNTER — Encounter (INDEPENDENT_AMBULATORY_CARE_PROVIDER_SITE_OTHER): Payer: Self-pay | Admitting: Family Medicine

## 2023-05-02 ENCOUNTER — Ambulatory Visit (INDEPENDENT_AMBULATORY_CARE_PROVIDER_SITE_OTHER): Admitting: Family Medicine

## 2023-05-02 VITALS — BP 111/70 | HR 68 | Temp 98.0°F | Ht 71.0 in | Wt 206.0 lb

## 2023-05-02 DIAGNOSIS — E559 Vitamin D deficiency, unspecified: Secondary | ICD-10-CM

## 2023-05-02 DIAGNOSIS — J3089 Other allergic rhinitis: Secondary | ICD-10-CM | POA: Insufficient documentation

## 2023-05-02 DIAGNOSIS — E669 Obesity, unspecified: Secondary | ICD-10-CM

## 2023-05-02 DIAGNOSIS — J302 Other seasonal allergic rhinitis: Secondary | ICD-10-CM

## 2023-05-02 DIAGNOSIS — Z6830 Body mass index (BMI) 30.0-30.9, adult: Secondary | ICD-10-CM

## 2023-05-02 DIAGNOSIS — E785 Hyperlipidemia, unspecified: Secondary | ICD-10-CM

## 2023-05-02 DIAGNOSIS — Z6828 Body mass index (BMI) 28.0-28.9, adult: Secondary | ICD-10-CM

## 2023-05-02 DIAGNOSIS — R7303 Prediabetes: Secondary | ICD-10-CM | POA: Diagnosis not present

## 2023-05-02 MED ORDER — RED YEAST RICE 600 MG PO TABS
2.0000 | ORAL_TABLET | Freq: Three times a day (TID) | ORAL | Status: DC
Start: 2023-05-02 — End: 2023-11-27

## 2023-05-02 MED ORDER — FLUTICASONE PROPIONATE 50 MCG/ACT NA SUSP: NASAL | 5 refills | Status: DC

## 2023-05-02 MED ORDER — LEVOCETIRIZINE DIHYDROCHLORIDE 5 MG PO TABS: 5.0000 mg | ORAL_TABLET | Freq: Every evening | ORAL | 1 refills | Status: DC

## 2023-05-02 MED ORDER — VITAMIN D (ERGOCALCIFEROL) 1.25 MG (50000 UNIT) PO CAPS: 50000.0000 [IU] | ORAL_CAPSULE | ORAL | 0 refills | Status: DC

## 2023-05-02 NOTE — Progress Notes (Signed)
 Maurice Hart, D.O.  ABFM, ABOM Specializing in Clinical Bariatric Medicine  Office located at: [address] [place], [place]  [address]   Assessment and Plan:   Orders Placed This Encounter  Procedures   Hemoglobin A1c   VITAMIN D 25 Hydroxy (Vit-D Deficiency, Fractures)   Lipid panel   Insulin, random   Comprehensive metabolic panel with GFR    Medications Discontinued During This Encounter  Medication Reason   Vitamin D, Ergocalciferol, (DRISDOL) 1.25 MG (50000 UNIT) CAPS capsule Reorder   fluticasone (FLONASE) 50 MCG/ACT nasal spray Reorder   levocetirizine (XYZAL) 5 MG tablet Reorder     Meds ordered this encounter  Medications   Vitamin D, Ergocalciferol, (DRISDOL) 1.25 MG (50000 UNIT) CAPS capsule    Sig: Take 1 capsule (50,000 Units total) by mouth every 7 (seven) days.    Dispense:  4 capsule    Refill:  0   Red Yeast Rice 600 MG TABS    Sig: Take 2 tablets (1,200 mg total) by mouth 3 (three) times daily with meals.   fluticasone (FLONASE) 50 MCG/ACT nasal spray    Sig: 1 spray each nostril twice daily after sinus rinses    Dispense:  16 g    Refill:  5   levocetirizine (XYZAL) 5 MG tablet    Sig: Take 1 tablet (5 mg total) by mouth every evening.    Dispense:  90 tablet    Refill:  1      FOR THE DISEASE OF OBESITY:  BMI 30.0-30.9,adult - Current BMI 28.74 Obesity (BMI 30-39.9) - Starting BMI 30.84 Assessment & Plan: Since last office visit on 04/03/2023 patient's  Muscle mass has decreased by 0.4 lb. Fat mass has decreased by 3.6 lb. Total body water has increased by 0.4 lb.  Counseling done on how various foods will affect these numbers and how to maximize success  Total lbs lost to date: 15 lbs  Total weight loss percentage to date: 6.79%    Recommended Dietary Goals Asif is currently in the action stage of change. As such, his goal is to continue weight management plan.  He has agreed to: continue current plan   Behavioral  Intervention We discussed the following today: visceral obesity and sources of healthy fats.   Additional resources provided today: Handout on Common Characteristics of Successful Weight Losers,  Evidence-based interventions for health behavior change were utilized today including the discussion of self monitoring techniques, problem-solving barriers and SMART goal setting techniques.   Regarding patient's less desirable eating habits and patterns, we employed the technique of small changes.   Pt will specifically work on: n/a   Recommended Physical Activity Goals Jefrey has been advised to work up to 150 minutes of moderate intensity aerobic activity a week and strengthening exercises 2-3 times per week for cardiovascular health, weight loss maintenance and preservation of muscle mass.   He has agreed to : continue to gradually increase the amount and intensity of exercise routine   Pharmacotherapy We both agreed to : continue with nutritional and behavioral strategies   FOR ASSOCIATED CONDITIONS ADDRESSED TODAY:  Prediabetes Assessment & Plan: Diet/lifestyle approach. His hunger and cravings are pretty well controlled. States he is adhering to his journaling parameters the majority of the time. He is enjoying all the foods he's eating. Continue tracking calories and grams of protein; will recheck labs today.   Relevant Orders:  -     Hemoglobin A1c -     Insulin,  random   Dyslipidemia (high LDL; low HDL) Assessment & Plan Most recent Lipid Panel:  Lab Results  Component Value Date   CHOL 178 01/12/2023   HDL 38 (L) 01/12/2023   LDLCALC 118 (H) 01/12/2023   TRIG 122 01/12/2023   CHOLHDL 4.7 01/12/2023    Reports starting Red Yeast Rice 1200 mg once daily on Feb 2nd, 2025. Pt endorses working diligently on reducing saturated and trans fats in his diet. He has also been increasing his walking and wt training regimen. Pt advised to INCREASE his Red Yeast Rice. Continue heart  healthy meal plan and exercise plan.  Relevant Orders:  -     Red Yeast Rice; Take 2 tablets (1,200 mg total) by mouth 3 (three) times daily with meals. -     Lipid panel -     Comprehensive metabolic panel with GFR   Environmental and seasonal allergies- allergic rhinitis Assessment & Plan: Pt has seasonal allergies that have been more bothersome with the change of weather/season. His symptoms and quality of life have improved by  walking with a mask outside, showering after any prolonged exposure to allergens during the day, and through using Flonase and Xyzal regularly. Continue medications - will refill today. Maintain w/ low inflammatory meal plan.   Relevant Orders:  -     Fluticasone Propionate; 1 spray each nostril twice daily after sinus rinses  Dispense: 16 g; Refill: 5 -     Levocetirizine Dihydrochloride; Take 1 tablet (5 mg total) by mouth every evening.  Dispense: 90 tablet; Refill: 1   Vitamin D deficiency Assessment & Plan: Most recent Vitamin D: Lab Results  Component Value Date   VD25OH 27.1 (L) 01/12/2023   Pt reports compliance with ergocalciferol 50,000 units once weekly. Continue supplementation - will recheck levels today.   Relevant Orders:  -     Vitamin D (Ergocalciferol); Take 1 capsule (50,000 Units total) by mouth every 7 (seven) days.  Dispense: 4 capsule; Refill: 0 -     VITAMIN D 25 Hydroxy (Vit-D Deficiency, Fractures)   Follow up:   Return 06/14/2023. He was informed of the importance of frequent follow up visits to maximize his success with intensive lifestyle modifications for his multiple health conditions.  Rudi Rummage is aware that we will review all of his lab results at our next visit.  He is aware that if anything is critical/ life threatening with the results, we will be contacting him via MyChart prior to the office visit to discuss management.    Subjective:   Chief complaint: Obesity Dishon is here to discuss his progress with his  obesity treatment plan. He is keeping a food journal and adhering to recommended goals of 1450-1550 calories and 115+ g of protein and states he is following his eating plan approximately 99% of the time. He states he is walking 3 miles 5-7 days per week and weight training 30-45 minutes daily.   Interval History:  ANIRUDH BAIZ is here for a follow up office visit. Since last OV on 04/03/2023, Mr.Fuertes is down 4 lbs. States he is adhering to his journaling parameters the majority of the time. He is enjoying all the foods he's eating. BP is well controlled today and has been well controlled at home averaging 110s-120/70s.  Pharmacotherapy for weight loss: He is currently taking no anti-obesity medication.   Review of Systems:  Pertinent positives were addressed with patient today.  Reviewed by clinician on day of visit: allergies, medications,  problem list, medical history, surgical history, family history, social history, and previous encounter notes.  Weight Summary and Biometrics   Weight Lost Since Last Visit: 4lb  Weight Gained Since Last Visit: 0   Vitals Temp: 98 F (36.7 C) BP: 111/70 Pulse Rate: 68 SpO2: 98 %   Anthropometric Measurements Height: 5\' 11"  (1.803 m) Weight: 206 lb (93.4 kg) BMI (Calculated): 28.74 Weight at Last Visit: 210lb Weight Lost Since Last Visit: 4lb Weight Gained Since Last Visit: 0 Starting Weight: 221lb Total Weight Loss (lbs): 15 lb (6.804 kg) Peak Weight: 258lb   Body Composition  Body Fat %: 28.3 % Fat Mass (lbs): 58.6 lbs Muscle Mass (lbs): 140.8 lbs Total Body Water (lbs): 96.4 lbs Visceral Fat Rating : 16   Other Clinical Data Fasting: yes Labs: yes Today's Visit #: 6 Starting Date: 01/10/23   Objective:   PHYSICAL EXAM: Blood pressure 111/70, pulse 68, temperature 98 F (36.7 C), height 5\' 11"  (1.803 m), weight 206 lb (93.4 kg), SpO2 98%. Body mass index is 28.73 kg/m.  General: he is overweight, cooperative and in no  acute distress. PSYCH: Has normal mood, affect and thought process.   HEENT: EOMI, sclerae are anicteric. Lungs: Normal breathing effort, no conversational dyspnea. Extremities: Moves * 4 Neurologic: A and O * 3, good insight  DIAGNOSTIC DATA REVIEWED: BMET    Component Value Date/Time   NA 142 01/12/2023 1451   K 4.5 01/12/2023 1451   CL 102 01/12/2023 1451   CO2 23 01/12/2023 1451   GLUCOSE 111 (H) 01/12/2023 1451   GLUCOSE 116 (H) 08/10/2022 0855   BUN 18 01/12/2023 1451   CREATININE 1.30 (H) 01/12/2023 1451   CREATININE 1.19 07/10/2020 0853   CALCIUM 9.6 01/12/2023 1451   GFRNONAA 75 10/22/2018 1523   GFRNONAA 62 09/01/2015 1445   GFRAA 87 10/22/2018 1523   GFRAA 72 09/01/2015 1445   Lab Results  Component Value Date   HGBA1C 5.9 (H) 01/12/2023   HGBA1C 6.4 (H) 09/01/2015   Lab Results  Component Value Date   INSULIN 9.3 01/12/2023   Lab Results  Component Value Date   TSH 2.610 01/12/2023   CBC    Component Value Date/Time   WBC 4.0 01/12/2023 1451   WBC 4.5 08/10/2022 0855   RBC 5.65 01/12/2023 1451   RBC 5.17 08/10/2022 0855   HGB 16.5 01/12/2023 1451   HCT 51.2 (H) 01/12/2023 1451   PLT 183 01/12/2023 1451   MCV 91 01/12/2023 1451   MCH 29.2 01/12/2023 1451   MCH 29.7 07/10/2020 0853   MCHC 32.2 01/12/2023 1451   MCHC 33.3 08/10/2022 0855   RDW 11.9 01/12/2023 1451   Iron Studies No results found for: "IRON", "TIBC", "FERRITIN", "IRONPCTSAT" Lipid Panel     Component Value Date/Time   CHOL 178 01/12/2023 1451   TRIG 122 01/12/2023 1451   HDL 38 (L) 01/12/2023 1451   CHOLHDL 4.7 01/12/2023 1451   CHOLHDL 5 08/10/2022 0855   VLDL 22.8 08/10/2022 0855   LDLCALC 118 (H) 01/12/2023 1451   LDLCALC 121 (H) 07/10/2020 0853   Hepatic Function Panel     Component Value Date/Time   PROT 7.2 01/12/2023 1451   ALBUMIN 4.7 01/12/2023 1451   AST 22 01/12/2023 1451   ALT 16 01/12/2023 1451   ALKPHOS 62 01/12/2023 1451   BILITOT 0.6 01/12/2023 1451    BILIDIR 0.2 08/10/2022 0855      Component Value Date/Time   TSH 2.610 01/12/2023 1451  Nutritional Lab Results  Component Value Date   VD25OH 27.1 (L) 01/12/2023    Attestations:   I, Special Puri, acting as a Stage manager for [employer], DO., have compiled all relevant documentation for today's office visit on behalf of Thomasene Lot, DO, while in the presence of [employer], DO.  I have reviewed the above documentation for accuracy and completeness, and I agree with the above. Maurice Hart, D.O.  The 21st Century Cures Act was signed into law in 2016 which includes the topic of electronic health records.  This provides immediate access to information in MyChart.  This includes consultation notes, operative notes, office notes, lab results and pathology reports.  If you have any questions about what you read please let us know at your next visit so we can discuss your concerns and take corrective action if need be.  We are right here with you.

## 2023-05-04 LAB — COMPREHENSIVE METABOLIC PANEL WITH GFR
ALT: 19 IU/L (ref 0–44)
AST: 20 IU/L (ref 0–40)
Albumin: 4.4 g/dL (ref 3.9–4.9)
Alkaline Phosphatase: 61 IU/L (ref 44–121)
BUN/Creatinine Ratio: 22 (ref 10–24)
BUN: 27 mg/dL (ref 8–27)
Bilirubin Total: 0.6 mg/dL (ref 0.0–1.2)
CO2: 19 mmol/L — ABNORMAL LOW (ref 20–29)
Calcium: 9.5 mg/dL (ref 8.6–10.2)
Chloride: 103 mmol/L (ref 96–106)
Creatinine, Ser: 1.24 mg/dL (ref 0.76–1.27)
Globulin, Total: 1.9 g/dL (ref 1.5–4.5)
Glucose: 95 mg/dL (ref 70–99)
Potassium: 4.3 mmol/L (ref 3.5–5.2)
Sodium: 138 mmol/L (ref 134–144)
Total Protein: 6.3 g/dL (ref 6.0–8.5)
eGFR: 64 mL/min/{1.73_m2} (ref >59)

## 2023-05-04 LAB — LIPID PANEL
Chol/HDL Ratio: 4.1 ratio (ref 0.0–5.0)
Cholesterol, Total: 168 mg/dL (ref 100–199)
HDL: 41 mg/dL (ref >39)
LDL Chol Calc (NIH): 113 mg/dL — ABNORMAL HIGH (ref 0–99)
Triglycerides: 74 mg/dL (ref 0–149)
VLDL Cholesterol Cal: 14 mg/dL (ref 5–40)

## 2023-05-04 LAB — HEMOGLOBIN A1C
Est. average glucose Bld gHb Est-mCnc: 123 mg/dL
Hgb A1c MFr Bld: 5.9 % — ABNORMAL HIGH (ref 4.8–5.6)

## 2023-05-04 LAB — INSULIN, RANDOM: INSULIN: 8.3 u[IU]/mL (ref 2.6–24.9)

## 2023-05-04 LAB — VITAMIN D 25 HYDROXY (VIT D DEFICIENCY, FRACTURES): Vit D, 25-Hydroxy: 65.7 ng/mL (ref 30.0–100.0)

## 2023-05-08 ENCOUNTER — Encounter (INDEPENDENT_AMBULATORY_CARE_PROVIDER_SITE_OTHER): Payer: Self-pay | Admitting: Family Medicine

## 2023-05-31 ENCOUNTER — Other Ambulatory Visit (INDEPENDENT_AMBULATORY_CARE_PROVIDER_SITE_OTHER): Payer: Self-pay | Admitting: Family Medicine

## 2023-05-31 DIAGNOSIS — E559 Vitamin D deficiency, unspecified: Secondary | ICD-10-CM

## 2023-06-14 ENCOUNTER — Encounter (INDEPENDENT_AMBULATORY_CARE_PROVIDER_SITE_OTHER): Payer: Self-pay | Admitting: Family Medicine

## 2023-06-14 ENCOUNTER — Ambulatory Visit (INDEPENDENT_AMBULATORY_CARE_PROVIDER_SITE_OTHER): Admitting: Family Medicine

## 2023-06-14 VITALS — BP 114/76 | HR 59 | Temp 97.9°F | Ht 71.0 in | Wt 206.0 lb

## 2023-06-14 DIAGNOSIS — E669 Obesity, unspecified: Secondary | ICD-10-CM

## 2023-06-14 DIAGNOSIS — E785 Hyperlipidemia, unspecified: Secondary | ICD-10-CM

## 2023-06-14 DIAGNOSIS — Z6830 Body mass index (BMI) 30.0-30.9, adult: Secondary | ICD-10-CM

## 2023-06-14 DIAGNOSIS — R7303 Prediabetes: Secondary | ICD-10-CM | POA: Diagnosis not present

## 2023-06-14 DIAGNOSIS — J3089 Other allergic rhinitis: Secondary | ICD-10-CM

## 2023-06-14 DIAGNOSIS — Z6828 Body mass index (BMI) 28.0-28.9, adult: Secondary | ICD-10-CM

## 2023-06-14 DIAGNOSIS — E559 Vitamin D deficiency, unspecified: Secondary | ICD-10-CM

## 2023-06-14 DIAGNOSIS — J302 Other seasonal allergic rhinitis: Secondary | ICD-10-CM

## 2023-06-14 MED ORDER — VITAMIN D (ERGOCALCIFEROL) 1.25 MG (50000 UNIT) PO CAPS
50000.0000 [IU] | ORAL_CAPSULE | ORAL | 1 refills | Status: DC
Start: 2023-06-14 — End: 2023-07-26

## 2023-06-14 MED ORDER — MONTELUKAST SODIUM 10 MG PO TABS: 10.0000 mg | ORAL_TABLET | Freq: Every day | ORAL | 1 refills | Status: DC

## 2023-06-14 NOTE — Patient Instructions (Signed)
 The 10-year ASCVD risk score (Arnett DK, et al., 2019) is: 12.1%   Values used to calculate the score:     Age: 68 years     Sex: Male     Is Non-Hispanic African American: No     Diabetic: No     Tobacco smoker: No     Systolic Blood Pressure: 114 mmHg     Is BP treated: No     HDL Cholesterol: 41 mg/dL     Total Cholesterol: 168 mg/dL

## 2023-06-14 NOTE — Progress Notes (Signed)
 Maurice Hart, D.O.  ABFM, ABOM Specializing in Clinical Bariatric Medicine  Office located at: [address] [place], [place]  [address]   Assessment and Plan:  No orders of the defined types were placed in this encounter.   Medications Discontinued During This Encounter  Medication Reason   Vitamin D , Ergocalciferol , (DRISDOL ) 1.25 MG (50000 UNIT) CAPS capsule Reorder     Meds ordered this encounter  Medications   Vitamin D , Ergocalciferol , (DRISDOL ) 1.25 MG (50000 UNIT) CAPS capsule    Sig: Take 1 capsule (50,000 Units total) by mouth every 7 (seven) days.    Dispense:  4 capsule    Refill:  1   montelukast  (SINGULAIR ) 10 MG tablet    Sig: Take 1 tablet (10 mg total) by mouth at bedtime.    Dispense:  30 tablet    Refill:  1      FOR THE DISEASE OF OBESITY: BMI 30.0-30.9,adult - Current BMI 28.74 Obesity (BMI 30-39.9) - Starting BMI 30.84 Assessment & Plan: Since last office visit on 05/02/23 patient's muscle mass has decreased by 1.2 lbs. Fat mass has increased by 0.6 lb. Total body water weight was maintained.  Counseling done on how various foods will affect these numbers and how to maximize success  Total lbs lost to date: 15 lbs Total weight loss percentage to date: -6.79%   Recommended Dietary Goals Maurice Hart is currently in the action stage of change. As such, his goal is to continue weight management plan.  He has agreed to: continue current plan   Behavioral Intervention We discussed the following today: decreasing simple carbohydrates  and decreasing eating out or consumption of processed foods, and making healthy choices when eating convenient foods  Additional resources provided today: None  Evidence-based interventions for health behavior change were utilized today including the discussion of self monitoring techniques, problem-solving barriers and SMART goal setting techniques.   Regarding patient's less desirable eating habits and patterns, we  employed the technique of small changes.   Pt will specifically work on: limit fatty meats (steaks) for next visit.    Recommended Physical Activity Goals [name] has been advised to work up to 300-450 minutes of moderate intensity aerobic activity a week and strengthening exercises 2-3 times per week for cardiovascular health, weight loss maintenance and preservation of muscle mass.   He has agreed to :  Continue current level of physical activity , Increase physical activity in their day and reduce sedentary time (increase NEAT)., and continue to gradually increase the amount and intensity of exercise routine   Pharmacotherapy We both agreed to : continue with nutritional and behavioral strategies   ASSOCIATED CONDITIONS ADDRESSED TODAY: Hyperlipidemia LDL goal <130 Assessment & Plan: Lab Results  Component Value Date   CHOL 168 05/02/2023   HDL 41 05/02/2023   LDLCALC 113 (H) 05/02/2023   TRIG 74 05/02/2023   CHOLHDL 4.1 05/02/2023   The 10-year ASCVD risk score (Arnett DK, et al., 2019) is: 12.1%   Values used to calculate the score:     Age: 68 years     Sex: Male     Is Non-Hispanic African American: No     Diabetic: No     Tobacco smoker: No     Systolic Blood Pressure: 114 mmHg     Is BP treated: No     HDL Cholesterol: 41 mg/dL     Total Cholesterol: 168 mg/dL  Not on statin therapy. Pt taking red yeast rice. Diet/exercise  approach. He is not interested in taking statins, has declined in the past. He reports eating steak at times while on vacation. Continue taking red yeast rice. Reviewed ASCVD risk score with pt; pt verbalized understanding. Recommend that he start high-dose statins to control; pt declined. Avoid eating any red meat more than twice a week. Reviewed healthy sources of fats. He did have a coronary calcium  score on 10/28/19 with results showing a "coronary calcium  score of 37. This was 74 rd percentile for age and sex matched control." I recommend he discuss  with his PCP whether further evaluation of cardiovascular risk with a possible CT or MRI is necessary.    Prediabetes Assessment & Plan: Lab Results  Component Value Date   HGBA1C 5.9 (H) 05/02/2023   HGBA1C 5.9 (H) 01/12/2023   HGBA1C 6.1 08/10/2022   INSULIN  8.3 05/02/2023   INSULIN  9.3 01/12/2023    No meds currently; diet/exercise approach. Some off-plan eating while on vacation but overall was still journaling calorie/protein intake and prioritizing proteins. Drinks 130 ounces of water daily and feels well hydrated throughout the day. Continue to decrease simple carbs/ sugars; increase fiber and proteins. Will continue monitoring as it pertains to his wt loss journey.    Vitamin D  deficiency Assessment & Plan: Lab Results  Component Value Date   VD25OH 65.7 05/02/2023   VD25OH 27.1 (L) 01/12/2023   Pt is on ERGO 50K units once weekly, good compliance. Tolerating well, no SE reported. NO acute concerns. Reviewed fat-soluble vitamins and importance of vitamin D  supplementation. Will recheck vit D levels periodically. Continue with current regimen, refill ERGO with no changes made today.   Orders: - Refill ERGO, no changes.    Environmental and seasonal allergies- allergic rhinitis Assessment & Plan: Pt has been using Xyzal  and Flonase . He also wears a mask when going on walks to avoid allergens. Was exposed to many allergens while traveling to many cities and stops in the last month. Continue current med regimen. Advised pt to try sinus rinses after exposure to allergens. Will continue monitoring as needed.  Orders: - Refill Singulair , no changes    Follow up:   Return in about 4 weeks (around 07/12/2023). He was informed of the importance of frequent follow up visits to maximize his success with intensive lifestyle modifications for his multiple health conditions.  Subjective:   Chief complaint: Obesity Maurice Hart is here to discuss his progress with his obesity treatment  plan. He is keeping a food journal and adhering to recommended goals of 1450-1550 calories and 115+ g of protein and states he is following his eating plan approximately 80% of the time. He states he is walking and weight training 60 minutes 5 days per week.  Interval History:  Maurice Hart is here for a follow up office visit. Since last OV on 4/1, he has maintained his weight. He brought his food scale with him while traveling and continued to journal every day. He reports some off-plan eating and struggled to meet his protein goals at one of the stops he made. He did gain weight but was able to lose some weight prior to his visit today.   Pharmacotherapy for weight loss: He is currently taking no anti-obesity medication.   Review of Systems:  Pertinent positives were addressed with patient today.  Reviewed by clinician on day of visit: allergies, medications, problem list, medical history, surgical history, family history, social history, and previous encounter notes.  Weight Summary and Biometrics   Weight  Lost Since Last Visit: 0  Weight Gained Since Last Visit: 0    Vitals Temp: 97.9 F (36.6 C) BP: 114/76 Pulse Rate: (!) 59 SpO2: 99 %   Anthropometric Measurements Height: 5\' 11"  (1.803 m) Weight: 206 lb (93.4 kg) BMI (Calculated): 28.74 Weight at Last Visit: 206lb Weight Lost Since Last Visit: 0 Weight Gained Since Last Visit: 0 Starting Weight: 221lb Total Weight Loss (lbs): 15 lb (6.804 kg) Peak Weight: 258lb   Body Composition  Body Fat %: 28.7 % Fat Mass (lbs): 59.2 lbs Muscle Mass (lbs): 139.6 lbs Total Body Water (lbs): 96.4 lbs Visceral Fat Rating : 16   Other Clinical Data Fasting: no Labs: no Today's Visit #: 7 Starting Date: 01/10/23    Objective:   PHYSICAL EXAM: Blood pressure 114/76, pulse (!) 59, temperature 97.9 F (36.6 C), height 5\' 11"  (1.803 m), weight 206 lb (93.4 kg), SpO2 99%. Body mass index is 28.73 kg/m.  General: he is  overweight, cooperative and in no acute distress. PSYCH: Has normal mood, affect and thought process.   HEENT: EOMI, sclerae are anicteric. Lungs: Normal breathing effort, no conversational dyspnea. Extremities: Moves * 4 Neurologic: A and O * 3, good insight  DIAGNOSTIC DATA REVIEWED: BMET    Component Value Date/Time   NA 138 05/02/2023 0936   K 4.3 05/02/2023 0936   CL 103 05/02/2023 0936   CO2 19 (L) 05/02/2023 0936   GLUCOSE 95 05/02/2023 0936   GLUCOSE 116 (H) 08/10/2022 0855   BUN 27 05/02/2023 0936   CREATININE 1.24 05/02/2023 0936   CREATININE 1.19 07/10/2020 0853   CALCIUM  9.5 05/02/2023 0936   GFRNONAA 75 10/22/2018 1523   GFRNONAA 62 09/01/2015 1445   GFRAA 87 10/22/2018 1523   GFRAA 72 09/01/2015 1445   Lab Results  Component Value Date   HGBA1C 5.9 (H) 05/02/2023   HGBA1C 6.4 (H) 09/01/2015   Lab Results  Component Value Date   INSULIN  8.3 05/02/2023   INSULIN  9.3 01/12/2023   Lab Results  Component Value Date   TSH 2.610 01/12/2023   CBC    Component Value Date/Time   WBC 4.0 01/12/2023 1451   WBC 4.5 08/10/2022 0855   RBC 5.65 01/12/2023 1451   RBC 5.17 08/10/2022 0855   HGB 16.5 01/12/2023 1451   HCT 51.2 (H) 01/12/2023 1451   PLT 183 01/12/2023 1451   MCV 91 01/12/2023 1451   MCH 29.2 01/12/2023 1451   MCH 29.7 07/10/2020 0853   MCHC 32.2 01/12/2023 1451   MCHC 33.3 08/10/2022 0855   RDW 11.9 01/12/2023 1451   Iron Studies No results found for: "IRON", "TIBC", "FERRITIN", "IRONPCTSAT" Lipid Panel     Component Value Date/Time   CHOL 168 05/02/2023 0936   TRIG 74 05/02/2023 0936   HDL 41 05/02/2023 0936   CHOLHDL 4.1 05/02/2023 0936   CHOLHDL 5 08/10/2022 0855   VLDL 22.8 08/10/2022 0855   LDLCALC 113 (H) 05/02/2023 0936   LDLCALC 121 (H) 07/10/2020 0853   Hepatic Function Panel     Component Value Date/Time   PROT 6.3 05/02/2023 0936   ALBUMIN 4.4 05/02/2023 0936   AST 20 05/02/2023 0936   ALT 19 05/02/2023 0936   ALKPHOS  61 05/02/2023 0936   BILITOT 0.6 05/02/2023 0936   BILIDIR 0.2 08/10/2022 0855      Component Value Date/Time   TSH 2.610 01/12/2023 1451   Nutritional Lab Results  Component Value Date   VD25OH 65.7 05/02/2023  VD25OH 27.1 (L) 01/12/2023    Attestations:   Cherryl Corona, acting as a medical scribe for Marceil Sensor, DO., have compiled all relevant documentation for today's office visit on behalf of Marceil Sensor, DO, while in the presence of [employer], DO.  Reviewed by clinician on day of visit: allergies, medications, problem list, medical history, surgical history, family history, social history, and previous encounter notes pertinent to patient's obesity diagnosis.  I have reviewed the above documentation for accuracy and completeness, and I agree with the above. Maurice Hart, D.O.  The 21st Century Cures Act was signed into law in 2016 which includes the topic of electronic health records.  This provides immediate access to information in MyChart.  This includes consultation notes, operative notes, office notes, lab results and pathology reports.  If you have any questions about what you read please let us  know at your next visit so we can discuss your concerns and take corrective action if need be.  We are right here with you.

## 2023-07-26 ENCOUNTER — Encounter (INDEPENDENT_AMBULATORY_CARE_PROVIDER_SITE_OTHER): Payer: Self-pay | Admitting: Family Medicine

## 2023-07-26 ENCOUNTER — Ambulatory Visit (INDEPENDENT_AMBULATORY_CARE_PROVIDER_SITE_OTHER): Admitting: Family Medicine

## 2023-07-26 VITALS — BP 119/77 | HR 60 | Temp 97.9°F | Ht 71.0 in | Wt 207.0 lb

## 2023-07-26 DIAGNOSIS — Z6828 Body mass index (BMI) 28.0-28.9, adult: Secondary | ICD-10-CM

## 2023-07-26 DIAGNOSIS — E785 Hyperlipidemia, unspecified: Secondary | ICD-10-CM

## 2023-07-26 DIAGNOSIS — E559 Vitamin D deficiency, unspecified: Secondary | ICD-10-CM | POA: Diagnosis not present

## 2023-07-26 DIAGNOSIS — E669 Obesity, unspecified: Secondary | ICD-10-CM

## 2023-07-26 MED ORDER — VITAMIN D (ERGOCALCIFEROL) 1.25 MG (50000 UNIT) PO CAPS: 50000.0000 [IU] | ORAL_CAPSULE | ORAL | 1 refills | Status: DC

## 2023-07-26 NOTE — Progress Notes (Signed)
 Maurice Maurice Hart, D.O.  ABFM, ABOM Specializing in Clinical Bariatric Medicine  Office located at: [address] [place], [place]  [address]   Assessment and Plan:  No orders of the defined types were placed in this encounter.  Medications Discontinued During This Encounter  Medication Reason   Vitamin D , Ergocalciferol , (DRISDOL ) 1.25 MG (50000 UNIT) CAPS capsule Reorder    Meds ordered this encounter  Medications   Vitamin D , Ergocalciferol , (DRISDOL ) 1.25 MG (50000 UNIT) CAPS capsule    Sig: Take 1 capsule (50,000 Units total) by mouth every 7 (seven) days.    Dispense:  4 capsule    Refill:  1     FOR THE DISEASE OF OBESITY: Obesity (BMI 30-39.9) - Starting BMI 30.84 BMI 28.0-28.9,adult -- Current BMI 28.88 Assessment & Plan: Since last office visit on 06/14/23 patient's muscle mass has increased by 0.6 lbs. Fat mass has increased by 0.8 lbs. Total body water has increased by 1.2 lbs.  Counseling done on how various foods will affect these numbers and how to maximize success  Total lbs lost to date: 14 lbs  Total weight loss percentage to date: -6.33%    Recommended Dietary Goals Maurice Hart is currently in the action stage of change. As such, his goal is to continue weight management plan.  He has agreed to: continue current plan   Behavioral Intervention We discussed the following today: increasing lean protein intake to established goals, decreasing simple carbohydrates , avoiding temptations and identifying enticing environmental cues, and better snacking choices and continue journaling.   Additional resources provided today: None  Evidence-based interventions for health behavior change were utilized today including the discussion of self monitoring techniques, problem-solving barriers and SMART goal setting techniques.   Regarding patient's less desirable eating habits and patterns, we employed the technique of small changes.   Pt will specifically work on:  n/a   Recommended Physical Activity Goals Maurice Hart has been advised to work up to 300-450 minutes of moderate intensity aerobic activity a week and strengthening exercises 2-3 times per week for cardiovascular health, weight loss maintenance and preservation of muscle mass.   He has agreed to: Continue current level of physical activity    Pharmacotherapy We both agreed to : Continue with current nutritional and behavioral strategies   ASSOCIATED CONDITIONS ADDRESSED TODAY:  Vitamin D  deficiency Assessment & Plan: Lab Results  Component Value Date   VD25OH 65.7 05/02/2023   VD25OH 27.1 (L) 01/12/2023   Compliant with ERGO 50K unit once weekly. Tolerating well with adverse side effects reported. No acute concerns today. Continue current supplementation regimen as prescribed. Will refill ERGO today.    Hyperlipidemia LDL goal <130 Assessment & Plan: Lab Results  Component Value Date   CHOL 168 05/02/2023   HDL 41 05/02/2023   LDLCALC 113 (H) 05/02/2023   TRIG 74 05/02/2023   CHOLHDL 4.1 05/02/2023   LDL was above goal per last obtained labs on 05/02/23. Currently taking red yeast rice 1,200 mg TID with meals, which he has also been taking with a small amount of olive oil. Pt enjoys red meats, especially steaks. He drinks about 120-130 ounces of water. Continue taking red yeast rice. Work on decreasing simple carbs and sugars; avoiding fatty meats; decrease saturated and trans fats. Advised pt to limit red meat consumption to at most twice a week. Continue to exercise as able.     Follow up:   Return in about 4 weeks (around 08/23/2023). He was informed  of the importance of frequent follow up visits to maximize his success with intensive lifestyle modifications for his multiple health conditions.  Subjective:   Chief complaint: Obesity Maurice Hart is here to discuss his progress with his obesity treatment plan.  He is keeping a food journal and adhering to recommended goals of 1450-1550  calories and 115+ g of protein and states he is following his eating plan approximately 70% of the time. He states he is walking and weight lifting 90 minutes 7 days per week.   Interval History:  Maurice Hart is here for a follow up office visit. Since last OV on 06/13/13, he gained 1 lb. He has been hitting his journaling goal about 42% of the time. He does report eating off plan deserts for his birthday and Father's Day, but notes he froze any left overs.   Pharmacotherapy for weight loss: He is currently taking no anti-obesity medication.   Review of Systems:  Pertinent positives were addressed with patient today.  Reviewed by clinician on day of visit: allergies, medications, problem list, medical history, surgical history, family history, social history, and previous encounter notes.  Weight Summary and Biometrics   Weight Lost Since Last Visit: 0lb  Weight Gained Since Last Visit: 1lb    Vitals Temp: 97.9 F (36.6 C) BP: 119/77 Pulse Rate: 60 SpO2: 99 %   Anthropometric Measurements Height: 5' 11 (1.803 m) Weight: 207 lb (93.9 kg) BMI (Calculated): 28.88 Weight at Last Visit: 206lb Weight Lost Since Last Visit: 0lb Weight Gained Since Last Visit: 1lb Starting Weight: 221lb Total Weight Loss (lbs): 14 lb (6.35 kg) Peak Weight: 258lb   Body Composition  Body Fat %: 28.9 % Fat Mass (lbs): 60 lbs Muscle Mass (lbs): 140.2 lbs Total Body Water (lbs): 97.6 lbs Visceral Fat Rating : 16   Other Clinical Data Fasting: No Labs: no Today's Visit #: 8 Starting Date: 01/10/23    Objective:   PHYSICAL EXAM: Blood pressure 119/77, pulse 60, temperature 97.9 F (36.6 C), height 5' 11 (1.803 m), weight 207 lb (93.9 kg), SpO2 99%. Body mass index is 28.87 kg/m.  General: he is overweight, cooperative and in no acute distress. PSYCH: Has normal mood, affect and thought process.   HEENT: EOMI, sclerae are anicteric. Lungs: Normal breathing effort, no  conversational dyspnea. Extremities: Moves * 4 Neurologic: A and O * 3, good insight  DIAGNOSTIC DATA REVIEWED: BMET    Component Value Date/Time   NA 138 05/02/2023 0936   K 4.3 05/02/2023 0936   CL 103 05/02/2023 0936   CO2 19 (L) 05/02/2023 0936   GLUCOSE 95 05/02/2023 0936   GLUCOSE 116 (H) 08/10/2022 0855   BUN 27 05/02/2023 0936   CREATININE 1.24 05/02/2023 0936   CREATININE 1.19 07/10/2020 0853   CALCIUM  9.5 05/02/2023 0936   GFRNONAA 75 10/22/2018 1523   GFRNONAA 62 09/01/2015 1445   GFRAA 87 10/22/2018 1523   GFRAA 72 09/01/2015 1445   Lab Results  Component Value Date   HGBA1C 5.9 (H) 05/02/2023   HGBA1C 6.4 (H) 09/01/2015   Lab Results  Component Value Date   INSULIN  8.3 05/02/2023   INSULIN  9.3 01/12/2023   Lab Results  Component Value Date   TSH 2.610 01/12/2023   CBC    Component Value Date/Time   WBC 4.0 01/12/2023 1451   WBC 4.5 08/10/2022 0855   RBC 5.65 01/12/2023 1451   RBC 5.17 08/10/2022 0855   HGB 16.5 01/12/2023 1451   HCT 51.2 (  H) 01/12/2023 1451   PLT 183 01/12/2023 1451   MCV 91 01/12/2023 1451   MCH 29.2 01/12/2023 1451   MCH 29.7 07/10/2020 0853   MCHC 32.2 01/12/2023 1451   MCHC 33.3 08/10/2022 0855   RDW 11.9 01/12/2023 1451   Iron Studies No results found for: IRON, TIBC, FERRITIN, IRONPCTSAT Lipid Panel     Component Value Date/Time   CHOL 168 05/02/2023 0936   TRIG 74 05/02/2023 0936   HDL 41 05/02/2023 0936   CHOLHDL 4.1 05/02/2023 0936   CHOLHDL 5 08/10/2022 0855   VLDL 22.8 08/10/2022 0855   LDLCALC 113 (H) 05/02/2023 0936   LDLCALC 121 (H) 07/10/2020 0853   Hepatic Function Panel     Component Value Date/Time   PROT 6.3 05/02/2023 0936   ALBUMIN 4.4 05/02/2023 0936   AST 20 05/02/2023 0936   ALT 19 05/02/2023 0936   ALKPHOS 61 05/02/2023 0936   BILITOT 0.6 05/02/2023 0936   BILIDIR 0.2 08/10/2022 0855      Component Value Date/Time   TSH 2.610 01/12/2023 1451   Nutritional Lab Results   Component Value Date   VD25OH 65.7 05/02/2023   VD25OH 27.1 (L) 01/12/2023    Attestations:   Maurice Hart, acting as a medical scribe for Maurice Jenkins, DO., have compiled all relevant documentation for today's office visit on behalf of Maurice Jenkins, DO, while in the presence of [employer], DO.  I have reviewed the above documentation for accuracy and completeness, and I agree with the above. Maurice Hart, D.O.  The 21st Century Cures Act was signed into law in 2016 which includes the topic of electronic health records.  This provides immediate access to information in MyChart.  This includes consultation notes, operative notes, office notes, lab results and pathology reports.  If you have any questions about what you read please let us  know at your next visit so we can discuss your concerns and take corrective action if need be.  We are right here with you.

## 2023-08-12 ENCOUNTER — Other Ambulatory Visit (INDEPENDENT_AMBULATORY_CARE_PROVIDER_SITE_OTHER): Payer: Self-pay | Admitting: Family Medicine

## 2023-08-21 ENCOUNTER — Other Ambulatory Visit (INDEPENDENT_AMBULATORY_CARE_PROVIDER_SITE_OTHER): Payer: Self-pay | Admitting: Family Medicine

## 2023-08-21 DIAGNOSIS — E559 Vitamin D deficiency, unspecified: Secondary | ICD-10-CM

## 2023-08-23 ENCOUNTER — Ambulatory Visit (INDEPENDENT_AMBULATORY_CARE_PROVIDER_SITE_OTHER): Admitting: Family Medicine

## 2023-08-23 ENCOUNTER — Encounter (INDEPENDENT_AMBULATORY_CARE_PROVIDER_SITE_OTHER): Payer: Self-pay | Admitting: Family Medicine

## 2023-08-23 VITALS — BP 119/77 | HR 71 | Temp 98.0°F | Ht 71.0 in | Wt 204.0 lb

## 2023-08-23 DIAGNOSIS — J3089 Other allergic rhinitis: Secondary | ICD-10-CM

## 2023-08-23 DIAGNOSIS — J302 Other seasonal allergic rhinitis: Secondary | ICD-10-CM

## 2023-08-23 DIAGNOSIS — E559 Vitamin D deficiency, unspecified: Secondary | ICD-10-CM | POA: Diagnosis not present

## 2023-08-23 DIAGNOSIS — E669 Obesity, unspecified: Secondary | ICD-10-CM

## 2023-08-23 DIAGNOSIS — Z6828 Body mass index (BMI) 28.0-28.9, adult: Secondary | ICD-10-CM

## 2023-08-23 DIAGNOSIS — E785 Hyperlipidemia, unspecified: Secondary | ICD-10-CM | POA: Diagnosis not present

## 2023-08-23 MED ORDER — VITAMIN D (ERGOCALCIFEROL) 1.25 MG (50000 UNIT) PO CAPS: 50000.0000 [IU] | ORAL_CAPSULE | ORAL | 1 refills | Status: DC

## 2023-08-23 NOTE — Progress Notes (Signed)
**Note Maurice Hart-Identified via Obfuscation**  Maurice Hart, D.O.  ABFM, ABOM Specializing in Clinical Bariatric Medicine  Office located at: [address] [place], [place]  [address]   Assessment and Plan:   Medications Discontinued During This Encounter  Medication Reason   Vitamin D , Ergocalciferol , (DRISDOL ) 1.25 MG (50000 UNIT) CAPS capsule Reorder   Meds ordered this encounter  Medications   Vitamin D , Ergocalciferol , (DRISDOL ) 1.25 MG (50000 UNIT) CAPS capsule    Sig: Take 1 capsule (50,000 Units total) by mouth every 7 (seven) days.    Dispense:  4 capsule    Refill:  1    Come fasting to next OV for fasting labs (CMP, FLP, A1c, fasting insulin , and vit D).    FOR THE DISEASE OF OBESITY: Obesity (BMI 30-39.9) - Starting BMI 30.84 BMI 28.0-28.9,adult -- Current BMI 28.45 Assessment & Plan: Since last office visit on 6/25 patient's muscle mass has decreased by 1 lb. Fat mass has decreased by 1.6 lbs. Total body water has decreased by 2.6 lbs.  Counseling done on how various foods will affect these numbers and how to maximize success  Total lbs lost to date: 17 lbs Total weight loss percentage to date: -7.69 %   Recommended Dietary Goals Maurice Hart is currently in the action stage of change. As such, his goal is to continue weight management plan.  Maurice Hart has agreed to: continue current plan   Behavioral Intervention We discussed the following today: increasing lean protein intake to established goals, decreasing eating out or consumption of processed foods, and making healthy choices when eating convenient foods, continue to work on maintaining a reduced calorie state, getting the recommended amount of protein, incorporating whole foods, making healthy choices, staying well hydrated and practicing mindfulness when eating., and focusing on food with a 10:1 ratio of calories: grams of protein  Additional resources provided today: Handout on Common Characteristics of Successful Weight Losers and Maintainers    Evidence-based interventions for health behavior change were utilized today including the discussion of self monitoring techniques, problem-solving barriers and SMART goal setting techniques.   Regarding patient's less desirable eating habits and patterns, we employed the technique of small changes.   Pt will specifically work on: n/a    Recommended Physical Activity Goals Maurice Hart has been advised to work up to 300-450 minutes of moderate intensity aerobic activity a week and strengthening exercises 2-3 times per week for cardiovascular health, weight loss maintenance and preservation of muscle mass.   Maurice Hart has agreed to: Continue current level of physical activity , Think about enjoyable ways to increase daily physical activity and overcoming barriers to exercise, and add at least 2 days of strength training to his routine.    Pharmacotherapy We both agreed to: Continue with current nutritional and behavioral strategies   ASSOCIATED CONDITIONS ADDRESSED TODAY:  Vitamin D  deficiency Assessment & Plan: Lab Results  Component Value Date   VD25OH 65.7 05/02/2023   VD25OH 27.1 (L) 01/12/2023   Taking ERGO 50 K units once weekly. Good compliance and tolerance. Has noticed improvements with energy levels.   Ideal vit D levels of 50-70 reviewed with pt. Continue current supplementation. Will refill ERGO today, no changes made. Will recheck Vit D at next OV.    Hyperlipidemia LDL goal <130 Dyslipidemia (high LDL; low HDL) Assessment & Plan: Lab Results  Component Value Date   CHOL 168 05/02/2023   HDL 41 05/02/2023   LDLCALC 113 (H) 05/02/2023   TRIG 74 05/02/2023   CHOLHDL 4.1 05/02/2023  Currently taking red yeast rice with good compliance and tolerance. Pt is back off ASA.   Continue with current regimen. Incorporate strength training to his exercise routine - at least twice weekly. I recommend pt discuss him discontinuing ASA with his PCP. Will recheck fasting lipid panel at  next OV.     Environmental and seasonal allergies- allergic rhinitis Assessment & Plan: Currently on Flonase  daily, Xyzal  5 mg daily, and Singulair  10 mg once daily. Pt denies any allergy issues since LOV. Has not needed to wear his mask while going on his walks. No acute concerns today.   Continue with current regimen as prescribed. Will continue monitoring.    Follow up:   Return in about 4 weeks (around 09/20/2023). Maurice Hart was informed of the importance of frequent follow up visits to maximize his success with intensive lifestyle modifications for his multiple health conditions.  Subjective:   Chief complaint: Obesity Maurice Hart is here to discuss his progress with his obesity treatment plan. Maurice Hart is keeping a food journal and adhering to recommended goals of 1450-1550 calories and 115+ g of protein and states Maurice Hart is following his eating plan approximately 90% of the time. Maurice Hart states Maurice Hart is walking 4 miles 90 minutes 5-7 days per week and sometimes doing daily resistance training.  Interval History:  Maurice Hart is here for a follow up office visit. Since last OV on 07/26/23, Maurice Hart is down 3 lbs. Maurice Hart has been prioritizing his protein intake, getting 133 grams daily on average. Eating protein and vegetables at each meals. Has been grilling his meats more during the summer. Has been measuring his protein intake daily, states Maurice Hart has been meeting his goal. Reports celebratory eating on the 4th of July; reports eating ribs. Has been walking in the neighborhood and reports there are hills.   Pharmacotherapy that aid with weight loss: Maurice Hart is currently taking no anti-obesity medication.   Review of Systems:  Pertinent positives were addressed with patient today.  Reviewed by clinician on day of visit: allergies, medications, problem list, medical history, surgical history, family history, social history, and previous encounter notes.  Weight Summary and Biometrics   Weight Lost Since Last Visit: 3lb  Weight  Gained Since Last Visit: 0lb    Vitals Temp: 98 F (36.7 C) BP: 119/77 Pulse Rate: 71 SpO2: 98 %   Anthropometric Measurements Height: 5' 11 (1.803 m) Weight: 204 lb (92.5 kg) BMI (Calculated): 28.46 Weight at Last Visit: 207lb Weight Lost Since Last Visit: 3lb Weight Gained Since Last Visit: 0lb Starting Weight: 221lb Total Weight Loss (lbs): 17 lb (7.711 kg) Peak Weight: 258lb   Body Composition  Body Fat %: 28.5 % Fat Mass (lbs): 58.4 lbs Muscle Mass (lbs): 139.2 lbs Total Body Water (lbs): 95 lbs Visceral Fat Rating : 16   Other Clinical Data Fasting: No Labs: No Today's Visit #: 9 Starting Date: 01/10/23 Comments: [id number]+    Objective:   PHYSICAL EXAM: Blood pressure 119/77, pulse 71, temperature 98 F (36.7 C), height 5' 11 (1.803 m), weight 204 lb (92.5 kg), SpO2 98%. Body mass index is 28.45 kg/m.  General: Maurice Hart is overweight, cooperative and in no acute distress. PSYCH: Has normal mood, affect and thought process.   HEENT: EOMI, sclerae are anicteric. Lungs: Normal breathing effort, no conversational dyspnea. Extremities: Moves * 4 Neurologic: A and O * 3, good insight  DIAGNOSTIC DATA REVIEWED: BMET    Component Value Date/Time   NA 138 05/02/2023 0936   K 4.3  05/02/2023 0936   CL 103 05/02/2023 0936   CO2 19 (L) 05/02/2023 0936   GLUCOSE 95 05/02/2023 0936   GLUCOSE 116 (H) 08/10/2022 0855   BUN 27 05/02/2023 0936   CREATININE 1.24 05/02/2023 0936   CREATININE 1.19 07/10/2020 0853   CALCIUM  9.5 05/02/2023 0936   GFRNONAA 75 10/22/2018 1523   GFRNONAA 62 09/01/2015 1445   GFRAA 87 10/22/2018 1523   GFRAA 72 09/01/2015 1445   Lab Results  Component Value Date   HGBA1C 5.9 (H) 05/02/2023   HGBA1C 6.4 (H) 09/01/2015   Lab Results  Component Value Date   INSULIN  8.3 05/02/2023   INSULIN  9.3 01/12/2023   Lab Results  Component Value Date   TSH 2.610 01/12/2023   CBC    Component Value Date/Time   WBC 4.0  01/12/2023 1451   WBC 4.5 08/10/2022 0855   RBC 5.65 01/12/2023 1451   RBC 5.17 08/10/2022 0855   HGB 16.5 01/12/2023 1451   HCT 51.2 (H) 01/12/2023 1451   PLT 183 01/12/2023 1451   MCV 91 01/12/2023 1451   MCH 29.2 01/12/2023 1451   MCH 29.7 07/10/2020 0853   MCHC 32.2 01/12/2023 1451   MCHC 33.3 08/10/2022 0855   RDW 11.9 01/12/2023 1451   Iron Studies No results found for: IRON, TIBC, FERRITIN, IRONPCTSAT Lipid Panel     Component Value Date/Time   CHOL 168 05/02/2023 0936   TRIG 74 05/02/2023 0936   HDL 41 05/02/2023 0936   CHOLHDL 4.1 05/02/2023 0936   CHOLHDL 5 08/10/2022 0855   VLDL 22.8 08/10/2022 0855   LDLCALC 113 (H) 05/02/2023 0936   LDLCALC 121 (H) 07/10/2020 0853   Hepatic Function Panel     Component Value Date/Time   PROT 6.3 05/02/2023 0936   ALBUMIN 4.4 05/02/2023 0936   AST 20 05/02/2023 0936   ALT 19 05/02/2023 0936   ALKPHOS 61 05/02/2023 0936   BILITOT 0.6 05/02/2023 0936   BILIDIR 0.2 08/10/2022 0855      Component Value Date/Time   TSH 2.610 01/12/2023 1451   Nutritional Lab Results  Component Value Date   VD25OH 65.7 05/02/2023   VD25OH 27.1 (L) 01/12/2023    Attestations:   Maurice Hart, acting as a medical scribe for Maurice Jenkins, DO., have compiled all relevant documentation for today's office visit on behalf of Maurice Jenkins, DO, while in the presence of [employer], DO.  I have reviewed the above documentation for accuracy and completeness, and I agree with the above. Maurice JINNY Hart, D.O.  The 21st Century Cures Act was signed into law in 2016 which includes the topic of electronic health records.  This provides immediate access to information in MyChart.  This includes consultation notes, operative notes, office notes, lab results and pathology reports.  If you have any questions about what you read please let us  know at your next visit so we can discuss your concerns and take corrective action if need be.   We are right here with you.

## 2023-09-28 ENCOUNTER — Ambulatory Visit (INDEPENDENT_AMBULATORY_CARE_PROVIDER_SITE_OTHER): Admitting: Family Medicine

## 2023-10-10 ENCOUNTER — Encounter (INDEPENDENT_AMBULATORY_CARE_PROVIDER_SITE_OTHER): Payer: Self-pay | Admitting: Family Medicine

## 2023-10-10 ENCOUNTER — Other Ambulatory Visit (INDEPENDENT_AMBULATORY_CARE_PROVIDER_SITE_OTHER): Payer: Self-pay | Admitting: Family Medicine

## 2023-10-10 ENCOUNTER — Ambulatory Visit (INDEPENDENT_AMBULATORY_CARE_PROVIDER_SITE_OTHER): Admitting: Family Medicine

## 2023-10-10 VITALS — BP 103/66 | HR 67 | Temp 97.6°F | Ht 71.0 in | Wt 201.0 lb

## 2023-10-10 DIAGNOSIS — E559 Vitamin D deficiency, unspecified: Secondary | ICD-10-CM | POA: Diagnosis not present

## 2023-10-10 DIAGNOSIS — E785 Hyperlipidemia, unspecified: Secondary | ICD-10-CM

## 2023-10-10 DIAGNOSIS — R7303 Prediabetes: Secondary | ICD-10-CM | POA: Diagnosis not present

## 2023-10-10 DIAGNOSIS — E669 Obesity, unspecified: Secondary | ICD-10-CM

## 2023-10-10 DIAGNOSIS — J3089 Other allergic rhinitis: Secondary | ICD-10-CM | POA: Diagnosis not present

## 2023-10-10 DIAGNOSIS — Z6828 Body mass index (BMI) 28.0-28.9, adult: Secondary | ICD-10-CM

## 2023-10-10 MED ORDER — MONTELUKAST SODIUM 10 MG PO TABS: 10.0000 mg | ORAL_TABLET | Freq: Every day | ORAL | 0 refills | Status: AC

## 2023-10-10 MED ORDER — LEVOCETIRIZINE DIHYDROCHLORIDE 5 MG PO TABS: 5.0000 mg | ORAL_TABLET | Freq: Every evening | ORAL | 0 refills | Status: AC

## 2023-10-10 MED ORDER — VITAMIN D (ERGOCALCIFEROL) 1.25 MG (50000 UNIT) PO CAPS: 50000.0000 [IU] | ORAL_CAPSULE | ORAL | 1 refills | Status: DC

## 2023-10-10 MED ORDER — FLUTICASONE PROPIONATE 50 MCG/ACT NA SUSP: NASAL | 2 refills | Status: AC

## 2023-10-10 NOTE — Progress Notes (Signed)
 Maurice Hart, D.O.  ABFM, ABOM Specializing in Clinical Bariatric Medicine  Office located at: [address] [place], [place]  [address]   Assessment and Plan:   Orders Placed This Encounter  Procedures   Comprehensive metabolic panel with GFR   Lipid panel   Hemoglobin A1c   VITAMIN D  25 Hydroxy (Vit-D Deficiency, Fractures)   Insulin , random    Medications Discontinued During This Encounter  Medication Reason   fluticasone  (FLONASE ) 50 MCG/ACT nasal spray Reorder   levocetirizine (XYZAL ) 5 MG tablet Reorder   montelukast  (SINGULAIR ) 10 MG tablet Reorder   Vitamin D , Ergocalciferol , (DRISDOL ) 1.25 MG (50000 UNIT) CAPS capsule Reorder     Meds ordered this encounter  Medications   montelukast  (SINGULAIR ) 10 MG tablet    Sig: Take 1 tablet (10 mg total) by mouth at bedtime.    Dispense:  90 tablet    Refill:  0   levocetirizine (XYZAL ) 5 MG tablet    Sig: Take 1 tablet (5 mg total) by mouth every evening.    Dispense:  90 tablet    Refill:  0   fluticasone  (FLONASE ) 50 MCG/ACT nasal spray    Sig: 1 spray each nostril twice daily after sinus Hart    Dispense:  16 g    Refill:  2   Vitamin D , Ergocalciferol , (DRISDOL ) 1.25 MG (50000 UNIT) CAPS capsule    Sig: Take 1 capsule (50,000 Units total) by mouth every 7 (seven) days.    Dispense:  4 capsule    Refill:  1      FOR THE DISEASE OF OBESITY:  Obesity (BMI 30-39.9) - Starting BMI 30.84 BMI 28.0-28.9,adult -- Current BMI 28.05 Assessment & Plan: Since last office visit on 08/23/23 patient's muscle mass has decreased by 1.2 lbs. Fat mass has decreased by 2 lbs. Total body water has decreased by 2.2 lbs.  Body fat % has decreased by 0.5 %. Counseling done on how various foods will affect these numbers and how to maximize success  Total lbs lost to date: -20 lbs Total weight loss percentage to date: -9.05 %   Recommended Dietary Goals Maurice Hart is currently in the action stage of change. As such, his goal  is to continue weight management plan.  He has agreed to: continue current plan   Behavioral Intervention We discussed the following today: increasing lean protein intake to established goals and increasing weight lifting exercise will help.  Additional resources provided today: None  Evidence-based interventions for health behavior change were utilized today including the discussion of self monitoring techniques, problem-solving barriers and SMART goal setting techniques.   Regarding patient's less desirable eating habits and patterns, we employed the technique of small changes.   Goal(s) for next OV: challenge body and do other exercises to burn more calories    Recommended Physical Activity Goals Maurice Hart has been advised to work up to 300-450 minutes of moderate intensity aerobic activity a week and strengthening exercises 2-3 times per week for cardiovascular health, weight loss maintenance and preservation of muscle mass.   He has agreed to: Continue to gradually increase the amount and intensity of exercise routine   Pharmacotherapy We both agreed to: Continue Regimen    ASSOCIATED CONDITIONS ADDRESSED TODAY:  Hyperlipidemia LDL goal <130 Assessment & Plan Lab Results  Component Value Date   CHOL 168 05/02/2023   HDL 41 05/02/2023   LDLCALC 113 (H) 05/02/2023   TRIG 74 05/02/2023   CHOLHDL 4.1 05/02/2023  Taking Red Yeast Rice 2 tablets (1200 mg) 3 times daily. Good compliance and no side effects. Pt will continue taking as advised. Discussed that adding in more weight lifting as tolerate will help. Will recheck labs today and discuss at next OV      Vitamin D  deficiency Assessment & Plan Lab Results  Component Value Date   VD25OH 65.7 05/02/2023   VD25OH 27.1 (L) 01/12/2023  Taking Ergo 50,000 units once weekly with good compliance and tolerance. Will refill today and continue taking. Goal is 50-70. Will obtain labs today and discuss at next  OV.    Prediabetes Assessment & Plan Lab Results  Component Value Date   HGBA1C 5.9 (H) 05/02/2023   HGBA1C 5.9 (H) 01/12/2023   HGBA1C 6.1 08/10/2022   INSULIN  8.3 05/02/2023   INSULIN  9.3 01/12/2023  Controlled with lifestyle and nutrinal interventions. He reports that his hunger is well controlled and most days the cravings are as well but he will crave a crunchy/salty snack. Discussed how snacks were okay as long as he was measuring how much he was eating in order to say in check with MP. Will continue following meal plan. Will obtain labs today and discuss at next OV.   Bp is at goal today denies any dizziness. Discussed how losing weight will continue to help with his BP.    Environmental and seasonal allergies- allergic rhinitis Assessment & Plan Maurice Hart has seasonal allergies that have been more bothersome with the change of weather/season. He states that during the summer he has not needed any medication but will begin the use of Xyzal  5 mg to help. We refilled today and recommended he also use Flonase  to help with symptoms. I encouraged use of sterile saline Hart such as Maurice Hart or Maurice Hart to be done once- twice daily and after any prolonged exposure to the environment or allergen. Restart Singulair  1 tablet 10 mg at bedtime.     Follow up:    11/27/23 at 9:40 AM  He was informed of the importance of frequent follow up visits to maximize his success with intensive lifestyle modifications for his multiple health conditions.  Maurice Hart is aware that we will review all of his lab results at our next visit together in person.  He is aware that if anything is critical/ life threatening with the results, we will be contacting him via MyChart or by my CMA will be calling them prior to the office visit to discuss acute management.     Subjective:    Chief complaint: Obesity Maurice Hart is here to discuss his progress with his obesity treatment plan. He is on keeping  a food journal and adhering to recommended goals of 1450-1550 calories and 115+ g of protein   and states he is following his eating plan approximately 100 % of the time. Pt is walking and weight training  60 minutes 7 days per week    Interval History:  Maurice Hart is here for a follow up office visit. Pt has experienced a weight loss of 3 lbs since last OV on 08/23/2023. He reports feeling good and not discouraged at all even of he has been feeling stuck at the weight he is at now. He has been journaling. He is walking about an hour and half daily to help keep his exercise in and is drinking more water.      Pharmacotherapy that aid with weight loss: He is currently taking no anti-obesity  medication.    Review of Systems:  Pertinent positives were addressed with patient today.  Reviewed by clinician on day of visit: allergies, medications, problem list, medical history, surgical history, family history, social history, and previous encounter notes.  Weight Summary and Biometrics   Weight Lost Since Last Visit: 3lb  Weight Gained Since Last Visit: 0    Vitals Temp: 97.6 F (36.4 C) BP: 103/66 Pulse Rate: 67 SpO2: 100 %   Anthropometric Measurements Height: 5' 11 (1.803 m) Weight: 201 lb (91.2 kg) BMI (Calculated): 28.05 Weight at Last Visit: 204lb Weight Lost Since Last Visit: 3lb Weight Gained Since Last Visit: 0 Starting Weight: 221lb Total Weight Loss (lbs): 20 lb (9.072 kg) Peak Weight: 258lb   Body Composition  Body Fat %: 28 % Fat Mass (lbs): 56.4 lbs Muscle Mass (lbs): 138 lbs Total Body Water (lbs): 92.8 lbs Visceral Fat Rating : 16   Other Clinical Data Fasting: yes Labs: yes Today's Visit #: 10 Starting Date: 01/10/23    Objective:   PHYSICAL EXAM: Blood pressure 103/66, pulse 67, temperature 97.6 F (36.4 C), height 5' 11 (1.803 m), weight 201 lb (91.2 kg), SpO2 100%. Body mass index is 28.03 kg/m.  General: he is overweight,  cooperative and in no acute distress. PSYCH: Has normal mood, affect and thought process.   HEENT: EOMI, sclerae are anicteric. Lungs: Normal breathing effort, no conversational dyspnea. Extremities: Moves * 4 Neurologic: A and O * 3, good insight  DIAGNOSTIC DATA REVIEWED: BMET    Component Value Date/Time   NA 138 05/02/2023 0936   K 4.3 05/02/2023 0936   CL 103 05/02/2023 0936   CO2 19 (L) 05/02/2023 0936   GLUCOSE 95 05/02/2023 0936   GLUCOSE 116 (H) 08/10/2022 0855   BUN 27 05/02/2023 0936   CREATININE 1.24 05/02/2023 0936   CREATININE 1.19 07/10/2020 0853   CALCIUM  9.5 05/02/2023 0936   GFRNONAA 75 10/22/2018 1523   GFRNONAA 62 09/01/2015 1445   GFRAA 87 10/22/2018 1523   GFRAA 72 09/01/2015 1445   Lab Results  Component Value Date   HGBA1C 5.9 (H) 05/02/2023   HGBA1C 6.4 (H) 09/01/2015   Lab Results  Component Value Date   INSULIN  8.3 05/02/2023   INSULIN  9.3 01/12/2023   Lab Results  Component Value Date   TSH 2.610 01/12/2023   CBC    Component Value Date/Time   WBC 4.0 01/12/2023 1451   WBC 4.5 08/10/2022 0855   RBC 5.65 01/12/2023 1451   RBC 5.17 08/10/2022 0855   HGB 16.5 01/12/2023 1451   HCT 51.2 (H) 01/12/2023 1451   PLT 183 01/12/2023 1451   MCV 91 01/12/2023 1451   MCH 29.2 01/12/2023 1451   MCH 29.7 07/10/2020 0853   MCHC 32.2 01/12/2023 1451   MCHC 33.3 08/10/2022 0855   RDW 11.9 01/12/2023 1451   Iron Studies No results found for: IRON, TIBC, FERRITIN, IRONPCTSAT Lipid Panel     Component Value Date/Time   CHOL 168 05/02/2023 0936   TRIG 74 05/02/2023 0936   HDL 41 05/02/2023 0936   CHOLHDL 4.1 05/02/2023 0936   CHOLHDL 5 08/10/2022 0855   VLDL 22.8 08/10/2022 0855   LDLCALC 113 (H) 05/02/2023 0936   LDLCALC 121 (H) 07/10/2020 0853   Hepatic Function Panel     Component Value Date/Time   PROT 6.3 05/02/2023 0936   ALBUMIN 4.4 05/02/2023 0936   AST 20 05/02/2023 0936   ALT 19 05/02/2023 0936   ALKPHOS 61  05/02/2023 0936   BILITOT 0.6 05/02/2023 0936   BILIDIR 0.2 08/10/2022 0855      Component Value Date/Time   TSH 2.610 01/12/2023 1451   Nutritional Lab Results  Component Value Date   VD25OH 65.7 05/02/2023   VD25OH 27.1 (L) 01/12/2023    Attestations:   LILLETTE Sonny Laroche, acting as a medical scribe for Maurice Jenkins, DO., have compiled all relevant documentation for today's office visit on behalf of Maurice Jenkins, DO, while in the presence of [employer], DO.    I have reviewed the above documentation for accuracy and completeness, and I agree with the above. Maurice JINNY Hart, D.O.  The 21st Century Cures Act was signed into law in 2016 which includes the topic of electronic health records.  This provides immediate access to information in MyChart.  This includes consultation notes, operative notes, office notes, lab results and pathology reports.  If you have any questions about what you read please let us  know at your next visit so we can discuss your concerns and take corrective action if need be.  We are right here with you.

## 2023-10-11 LAB — COMPREHENSIVE METABOLIC PANEL WITH GFR
ALT: 19 IU/L (ref 0–44)
AST: 18 IU/L (ref 0–40)
Albumin: 4.2 g/dL (ref 3.9–4.9)
Alkaline Phosphatase: 57 IU/L (ref 44–121)
BUN/Creatinine Ratio: 18 (ref 10–24)
BUN: 23 mg/dL (ref 8–27)
Bilirubin Total: 0.7 mg/dL (ref 0.0–1.2)
CO2: 25 mmol/L (ref 20–29)
Calcium: 9.6 mg/dL (ref 8.6–10.2)
Chloride: 103 mmol/L (ref 96–106)
Creatinine, Ser: 1.25 mg/dL (ref 0.76–1.27)
Globulin, Total: 2.2 g/dL (ref 1.5–4.5)
Glucose: 96 mg/dL (ref 70–99)
Potassium: 4.4 mmol/L (ref 3.5–5.2)
Sodium: 140 mmol/L (ref 134–144)
Total Protein: 6.4 g/dL (ref 6.0–8.5)
eGFR: 63 mL/min/1.73 (ref >59)

## 2023-10-11 LAB — HEMOGLOBIN A1C
Est. average glucose Bld gHb Est-mCnc: 111 mg/dL
Hgb A1c MFr Bld: 5.5 % (ref 4.8–5.6)

## 2023-10-11 LAB — INSULIN, RANDOM: INSULIN: 8.9 u[IU]/mL (ref 2.6–24.9)

## 2023-10-11 LAB — LIPID PANEL
Chol/HDL Ratio: 4.2 ratio (ref 0.0–5.0)
Cholesterol, Total: 168 mg/dL (ref 100–199)
HDL: 40 mg/dL (ref >39)
LDL Chol Calc (NIH): 111 mg/dL — ABNORMAL HIGH (ref 0–99)
Triglycerides: 94 mg/dL (ref 0–149)
VLDL Cholesterol Cal: 17 mg/dL (ref 5–40)

## 2023-10-11 LAB — VITAMIN D 25 HYDROXY (VIT D DEFICIENCY, FRACTURES): Vit D, 25-Hydroxy: 54.5 ng/mL (ref 30.0–100.0)

## 2023-11-27 ENCOUNTER — Ambulatory Visit (INDEPENDENT_AMBULATORY_CARE_PROVIDER_SITE_OTHER): Admitting: Family Medicine

## 2023-11-27 ENCOUNTER — Encounter (INDEPENDENT_AMBULATORY_CARE_PROVIDER_SITE_OTHER): Payer: Self-pay | Admitting: Family Medicine

## 2023-11-27 VITALS — BP 103/70 | HR 68 | Temp 98.0°F | Ht 71.0 in | Wt 200.0 lb

## 2023-11-27 DIAGNOSIS — E785 Hyperlipidemia, unspecified: Secondary | ICD-10-CM | POA: Diagnosis not present

## 2023-11-27 DIAGNOSIS — E559 Vitamin D deficiency, unspecified: Secondary | ICD-10-CM

## 2023-11-27 DIAGNOSIS — E669 Obesity, unspecified: Secondary | ICD-10-CM

## 2023-11-27 DIAGNOSIS — Z6827 Body mass index (BMI) 27.0-27.9, adult: Secondary | ICD-10-CM

## 2023-11-27 DIAGNOSIS — E65 Localized adiposity: Secondary | ICD-10-CM

## 2023-11-27 DIAGNOSIS — R7303 Prediabetes: Secondary | ICD-10-CM | POA: Diagnosis not present

## 2023-11-27 DIAGNOSIS — Z6828 Body mass index (BMI) 28.0-28.9, adult: Secondary | ICD-10-CM

## 2023-11-27 MED ORDER — VITAMIN D (ERGOCALCIFEROL) 1.25 MG (50000 UNIT) PO CAPS: 50000.0000 [IU] | ORAL_CAPSULE | ORAL | 0 refills | Status: AC

## 2023-11-27 MED ORDER — RED YEAST RICE 600 MG PO TABS: 2.0000 | ORAL_TABLET | Freq: Three times a day (TID) | ORAL | Status: AC

## 2023-11-27 NOTE — Progress Notes (Signed)
 Maurice Hart, D.O.  ABFM, ABOM Specializing in Clinical Bariatric Medicine  Office located at: [address] [place], [place]  [address]      A) FOR THE CHRONIC DISEASE OF OBESITY:  Chief complaint: Obesity Maurice Hart is here to discuss his progress with his obesity treatment plan.   History of present illness / Interval history:  Maurice Hart is here today for his follow-up office visit.  Since last OV on 10/10/2023, pt is down 1 lb.   Went to [place]  and ate some off-plan foods, but tried to adhere to MP for the most part.    10/10/23 09:00 11/27/23 09:00   Body Fat % 28 % 28 %  Muscle Mass (lbs) 138 lbs 137.4 lbs  Fat Mass (lbs) 56.4 lbs 56.2 lbs  Total Body Water (lbs) 92.8 lbs 94.8 lbs  Visceral Fat Rating  16 16   Counseling done on how various foods will affect these numbers and how to maximize success   Total lbs lost to date: -21 lbs Total Fat Mass in lbs lost to date: -13.6 Total weight loss percentage to date: -9.50 %   Nutrition Therapy He is  keeping a food journal and adhering to recommended goals of 1450-1550 calories and 115+ g of protein and states he is following his eating plan approximately 75 % of the time.   - Tracking Calories/Macros: yes  - Eating More Whole Foods: yes  - Adequate Protein Intake: yes  - Adequate Water Intake: yes  - Skipping Meals: no -    - Sleeping 7-9 Hours/ Night: yes   Maurice Hart is currently in the action stage of change. As such, his goal is to continue weight management plan.  He has agreed to: continue current plan   Physical Activity Pt  is walking and doing resistance training 90+  minutes 5-7 days per week   Maurice Hart has been advised to work up to 300-450 minutes of moderate intensity aerobic activity a week and strengthening exercises 2-3 times per week for cardiovascular health, weight loss maintenance and preservation of muscle mass.  He has agreed to : Think about enjoyable ways to increase daily physical  activity and overcoming barriers to exercise, Increase physical activity in their day and reduce sedentary time (increase NEAT)., Increase volume of physical activity to a goal of 240 minutes a week, and Combine aerobic and strengthening exercises for efficiency and improved cardiometabolic health.   Behavioral Modifications Evidence-based interventions for health behavior change were utilized today including the discussion of  1) self monitoring techniques:  journal 2) problem-solving barriers:  none 3) self care:  none 4) SMART goals for next OV:  Challenge body with different exercises.  Regarding patient's less desirable eating habits and patterns, we employed the technique of small changes.   We discussed the following today: decreasing simple carbohydrates  and increasing water intake  Additional resources provided today: None   Medical Interventions/ Pharmacotherapy Previous Bariatric surgery: none Pharmacotherapy for weight loss: He is not currently taking medications  for medical weight loss.    We discussed various medication options to help Maurice Hart with his weight loss efforts and we both agreed to : Continue with current nutritional and behavioral strategies   B) OBESITY RELATED CONDITIONS ADDRESSED TODAY:  Obesity (BMI 30-39.9) - Starting BMI 30.84 BMI 28.0-28.9,adult -- Current BMI 27.91  Prediabetes Assessment & Plan Lab Results  Component Value Date   HGBA1C 5.5 10/10/2023   HGBA1C 5.9 (H) 05/02/2023  HGBA1C 5.9 (H) 01/12/2023   INSULIN  8.9 10/10/2023   INSULIN  8.3 05/02/2023   INSULIN  9.3 01/12/2023    Lab Results  Component Value Date   CREATININE 1.25 10/10/2023   BUN 23 10/10/2023   NA 140 10/10/2023   K 4.4 10/10/2023   CL 103 10/10/2023   CO2 25 10/10/2023  Controlled with dietary and lifestyle interventions. Hunger and cravings have been well controlled. Reviewed labs with pt from 10/10/2023. A1c has improved from 5.9 on 05/02/2023  to 5.5 on 10/10/2023.  Cont decreasing simple carbs and cont adequate protein intake.   Reviewed CMP from 10/10/2023. Kidney function is slightly elevated but has improved from 05/02/2023 and is stable. Reports getting 140 oz of water intake and gets in over 100+ grams of protein. Educated pt that BUN can increase when you get in too much protein. Cont exercising and adequate water intake.     Vitamin D  deficiency Assessment & Plan  Lab Results  Component Value Date   VD25OH 54.5 10/10/2023   VD25OH 65.7 05/02/2023   VD25OH 27.1 (L) 01/12/2023  Currently on Ergo 50,000 units once weekly with good compliance and tolerance. Vit D is at goal today. No acute concerns. Cont supplementation (refill today) and will check levels in 3 months.     Dyslipidemia (high LDL; low HDL) Assessment & Plan Lab Results  Component Value Date   CHOL 168 10/10/2023   HDL 40 10/10/2023   LDLCALC 111 (H) 10/10/2023   TRIG 94 10/10/2023   CHOLHDL 4.2 10/10/2023      Component Value Date/Time   PROT 6.4 10/10/2023 1323   ALBUMIN 4.2 10/10/2023 1323   AST 18 10/10/2023 1323   ALT 19 10/10/2023 1323   ALKPHOS 57 10/10/2023 1323   BILITOT 0.7 10/10/2023 1323   BILIDIR 0.2 08/10/2022 0855  Currently taking Red Yeast rice 2 tablets 1200 mg 3 times daily with good compliance and tolerance. reviewed labs with pt from 10/10/2023. LDLS have improved from 113 on 05/02/2023 to 111 on 10/10/2023. HDL has improved from 38 to 40. Discussed fish oils and omega 3 and omega 6 to help control HDL levels. Cont decreasing saturated and trans fats and increasing exercise.     Liver function is good and stable. Cont decreasing fats.       Medications Discontinued During This Encounter  Medication Reason   Red Yeast Rice 600 MG TABS Reorder   Vitamin D , Ergocalciferol , (DRISDOL ) 1.25 MG (50000 UNIT) CAPS capsule Reorder     Meds ordered this encounter  Medications   Red Yeast Rice 600 MG TABS    Sig: Take 2 tablets (1,200 mg total) by mouth 3  (three) times daily with meals.   Vitamin D , Ergocalciferol , (DRISDOL ) 1.25 MG (50000 UNIT) CAPS capsule    Sig: Take 1 capsule (50,000 Units total) by mouth every 7 (seven) days.    Dispense:  12 capsule    Refill:  0      Follow up:   Return 01/09/2024 9:20 AM.  He was informed of the importance of frequent follow up visits to maximize his success with intensive lifestyle modifications for his multiple health conditions.   Weight Summary and Biometrics   Weight Lost Since Last Visit: 1lb  Weight Gained Since Last Visit: 0lb    Vitals Temp: 98 F (36.7 C) BP: 103/70 Pulse Rate: 68 SpO2: 96 %   Anthropometric Measurements Height: 5' 11 (1.803 m) Weight: 200 lb (90.7 kg) BMI (Calculated): 27.91 Weight at  Last Visit: 201lb Weight Lost Since Last Visit: 1lb Weight Gained Since Last Visit: 0lb Starting Weight: 221lb Total Weight Loss (lbs): 21 lb (9.526 kg) Peak Weight: 258lb   Body Composition  Body Fat %: 28 % Fat Mass (lbs): 56.2 lbs Muscle Mass (lbs): 137.4 lbs Total Body Water (lbs): 94.8 lbs Visceral Fat Rating : 16   Other Clinical Data Fasting: yes Labs: no Today's Visit #: 11 Starting Date: 01/10/23    Objective:   PHYSICAL EXAM: Blood pressure 103/70, pulse 68, temperature 98 F (36.7 C), height 5' 11 (1.803 m), weight 200 lb (90.7 kg), SpO2 96%. Body mass index is 27.89 kg/m.  General: he is overweight, cooperative and in no acute distress. PSYCH: Has normal mood, affect and thought process.   HEENT: EOMI, sclerae are anicteric. Lungs: Normal breathing effort, no conversational dyspnea. Extremities: Moves * 4 Neurologic: A and O * 3, good insight  DIAGNOSTIC DATA REVIEWED: BMET    Component Value Date/Time   NA 140 10/10/2023 1323   K 4.4 10/10/2023 1323   CL 103 10/10/2023 1323   CO2 25 10/10/2023 1323   GLUCOSE 96 10/10/2023 1323   GLUCOSE 116 (H) 08/10/2022 0855   BUN 23 10/10/2023 1323   CREATININE 1.25 10/10/2023 1323    CREATININE 1.19 07/10/2020 0853   CALCIUM  9.6 10/10/2023 1323   GFRNONAA 75 10/22/2018 1523   GFRNONAA 62 09/01/2015 1445   GFRAA 87 10/22/2018 1523   GFRAA 72 09/01/2015 1445   Lab Results  Component Value Date   HGBA1C 5.5 10/10/2023   HGBA1C 6.4 (H) 09/01/2015   Lab Results  Component Value Date   INSULIN  8.9 10/10/2023   INSULIN  9.3 01/12/2023   Lab Results  Component Value Date   TSH 2.610 01/12/2023   CBC    Component Value Date/Time   WBC 4.0 01/12/2023 1451   WBC 4.5 08/10/2022 0855   RBC 5.65 01/12/2023 1451   RBC 5.17 08/10/2022 0855   HGB 16.5 01/12/2023 1451   HCT 51.2 (H) 01/12/2023 1451   PLT 183 01/12/2023 1451   MCV 91 01/12/2023 1451   MCH 29.2 01/12/2023 1451   MCH 29.7 07/10/2020 0853   MCHC 32.2 01/12/2023 1451   MCHC 33.3 08/10/2022 0855   RDW 11.9 01/12/2023 1451   Iron Studies No results found for: IRON, TIBC, FERRITIN, IRONPCTSAT Lipid Panel     Component Value Date/Time   CHOL 168 10/10/2023 1323   TRIG 94 10/10/2023 1323   HDL 40 10/10/2023 1323   CHOLHDL 4.2 10/10/2023 1323   CHOLHDL 5 08/10/2022 0855   VLDL 22.8 08/10/2022 0855   LDLCALC 111 (H) 10/10/2023 1323   LDLCALC 121 (H) 07/10/2020 0853   Hepatic Function Panel     Component Value Date/Time   PROT 6.4 10/10/2023 1323   ALBUMIN 4.2 10/10/2023 1323   AST 18 10/10/2023 1323   ALT 19 10/10/2023 1323   ALKPHOS 57 10/10/2023 1323   BILITOT 0.7 10/10/2023 1323   BILIDIR 0.2 08/10/2022 0855      Component Value Date/Time   TSH 2.610 01/12/2023 1451   Nutritional Lab Results  Component Value Date   VD25OH 54.5 10/10/2023   VD25OH 65.7 05/02/2023   VD25OH 27.1 (L) 01/12/2023    Attestations:   LILLETTE Feliciano Mingle, acting as a stage manager for Maurice Jenkins, DO., have compiled all relevant documentation for today's office visit on behalf of Maurice Jenkins, DO, while in the presence of [employer], DO.  I have reviewed the above documentation for  accuracy and completeness, and I agree with the above. Maurice JINNY Hart, D.O.  The 21st Century Cures Act was signed into law in 2016 which includes the topic of electronic health records.  This provides immediate access to information in MyChart.  This includes consultation notes, operative notes, office notes, lab results and pathology reports.  If you have any questions about what you read please let us  know at your next visit so we can discuss your concerns and take corrective action if need be.  We are right here with you.

## 2023-12-06 ENCOUNTER — Other Ambulatory Visit (HOSPITAL_BASED_OUTPATIENT_CLINIC_OR_DEPARTMENT_OTHER): Payer: Self-pay

## 2023-12-06 MED ORDER — COMIRNATY 30 MCG/0.3ML IM SUSY
0.3000 mL | PREFILLED_SYRINGE | Freq: Once | INTRAMUSCULAR | 0 refills | Status: AC
  Filled 2023-12-06: qty 0.3, 1d supply, fill #0

## 2023-12-20 DIAGNOSIS — K080 Exfoliation of teeth due to systemic causes: Secondary | ICD-10-CM | POA: Diagnosis not present

## 2024-01-09 ENCOUNTER — Ambulatory Visit (INDEPENDENT_AMBULATORY_CARE_PROVIDER_SITE_OTHER): Payer: Self-pay | Admitting: Family Medicine

## 2024-01-19 IMAGING — MR MR HEAD W/O CM
10 series · 48 of 48 positions shown · non-contrast
Comparison: CT head 07/11/2016

CLINICAL DATA: Acute neuro deficit.

EXAM:
MRI HEAD WITHOUT CONTRAST
TECHNIQUE: Multiplanar, multiecho pulse sequences of the brain and surrounding
structures were obtained without intravenous contrast.

[Series 2: T1 · sagittal · 5.0mm · 0.47mm/px · 3 of 27 slices shown (1 of 2)]
[im 1/27]
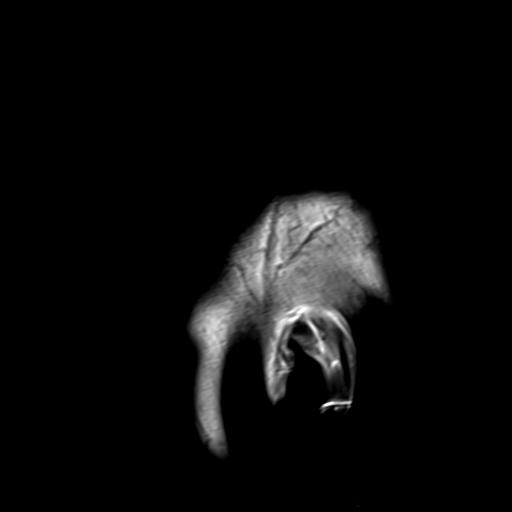
[im 14/27]
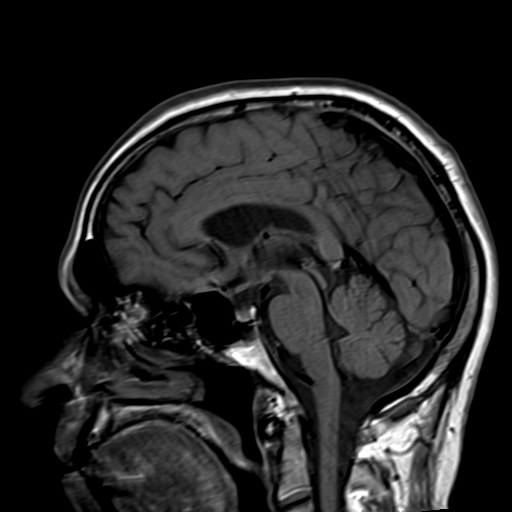
[im 27/27]
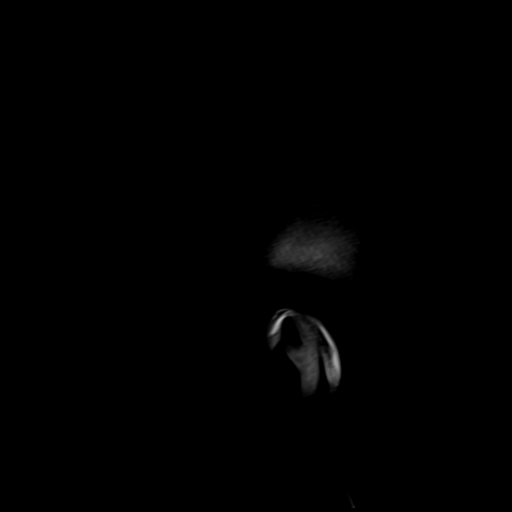

[Series 3: ax ep2d_diff_3 · axial · 3.0mm · 1.80mm/px · z∈[-70,+83]mm · 8 of 102 slices shown]
[im 1/102]
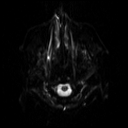
[im 15/102]
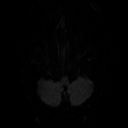
[im 29/102]
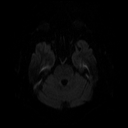
[im 44/102]
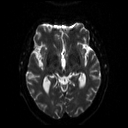
[im 58/102]
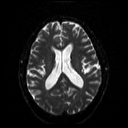
[im 73/102]
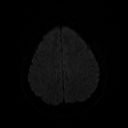
[im 87/102]
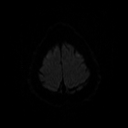
[im 102/102]
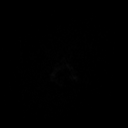

[Series 4: ax ep2d_diff_3_adc · axial · 3.0mm · 1.80mm/px · z∈[-70,+83]mm · 4 of 53 slices shown]
[im 1/53]
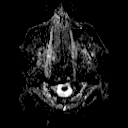
[im 18/53]
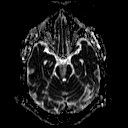
[im 35/53]
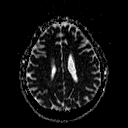
[im 53/53]
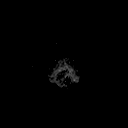

[Series 5: cor ep2d_diffusion · coronal · 5.0mm · 1.77mm/px · 5 of 62 slices shown]
[im 1/62]
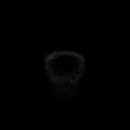
[im 16/62]
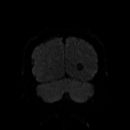
[im 31/62]
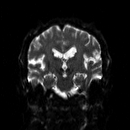
[im 46/62]
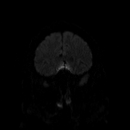
[im 62/62]
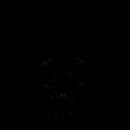

[Series 6: cor ep2d_diffusion_adc · coronal · 5.0mm · 1.77mm/px · 2 of 31 slices shown]
[im 1/31]
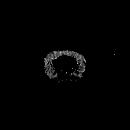
[im 31/31]
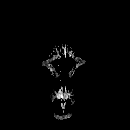

[Series 7: T2 · axial · 5.0mm · 0.51mm/px · z∈[-82,+84]mm · 2 of 29 slices shown (1 of 2)]
[im 1/29]
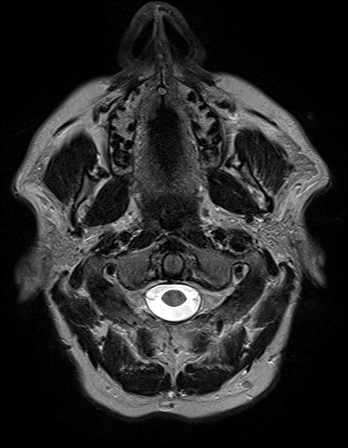
[im 29/29]
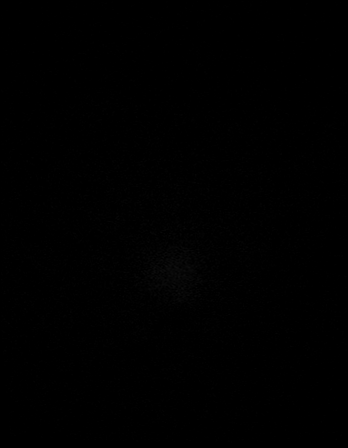

[Series 9: swi_images · axial · 3.0mm · 0.90mm/px · z∈[-82,+81]mm · 4 of 56 slices shown]
[im 1/56]
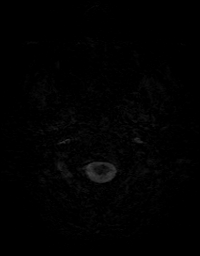
[im 19/56]
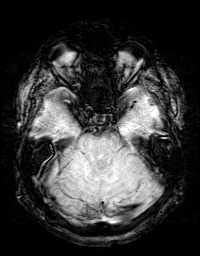
[im 37/56]
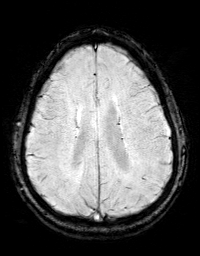
[im 56/56]
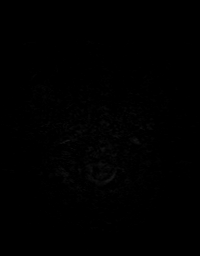

[Series 10: FLAIR · axial · 3.0mm · 0.45mm/px · z∈[-85,+83]mm · 3 of 44 slices shown]
[im 1/44]
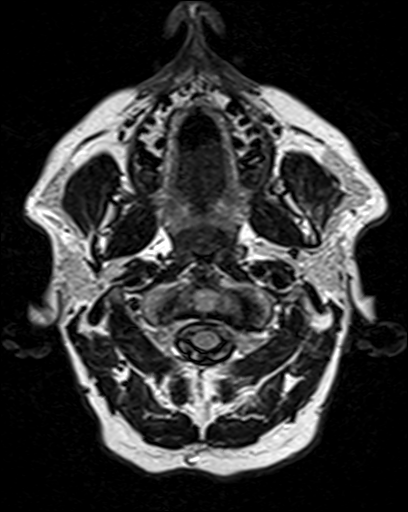
[im 22/44]
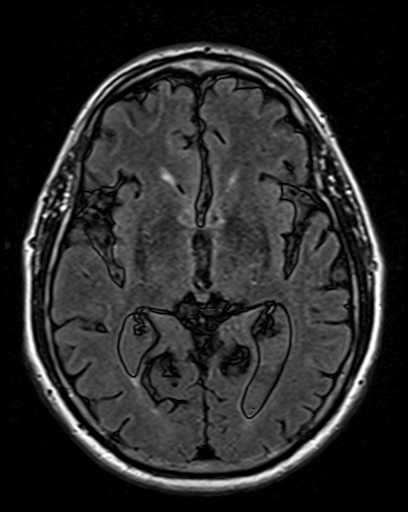
[im 44/44]
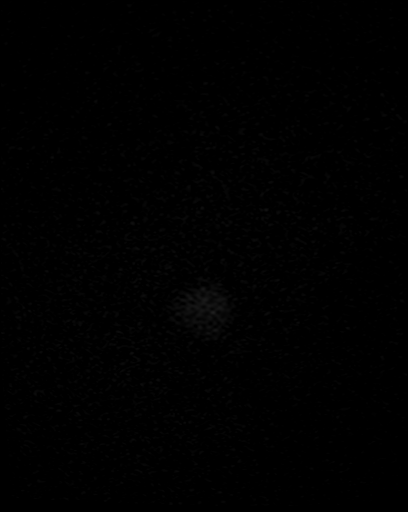

[Series 11: T1 · axial · 1.0mm · 0.75mm/px · z∈[-83,+90]mm · 14 of 176 slices shown (2 of 2)]
[im 1/176]
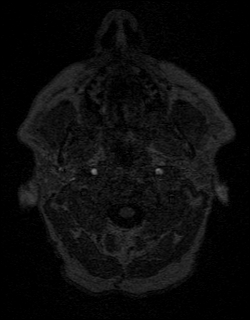
[im 14/176]
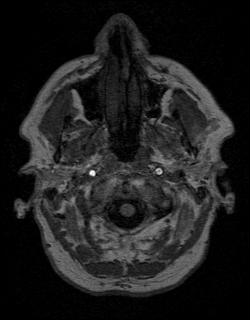
[im 27/176]
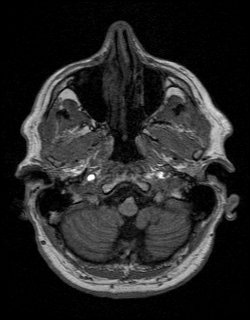
[im 41/176]
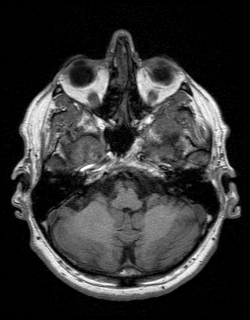
[im 54/176]
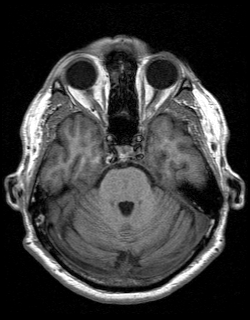
[im 68/176]
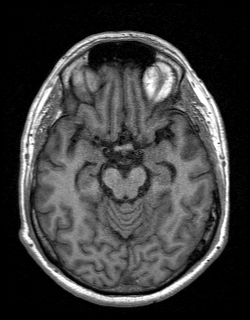
[im 81/176]
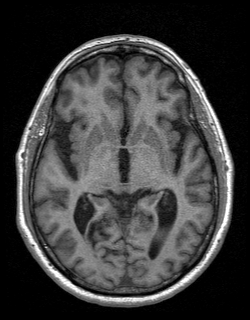
[im 95/176]
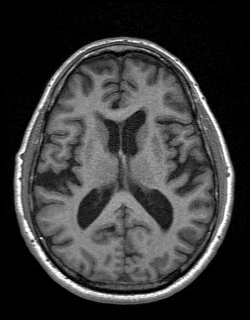
[im 108/176]
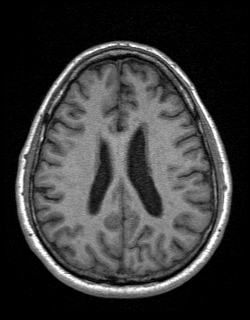
[im 122/176]
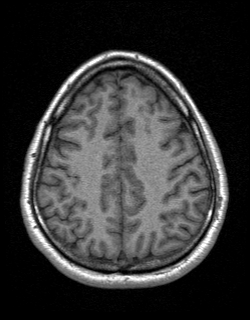
[im 135/176]
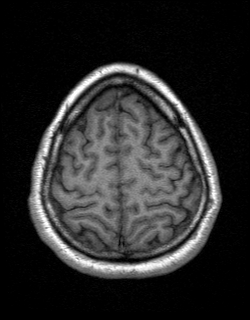
[im 149/176]
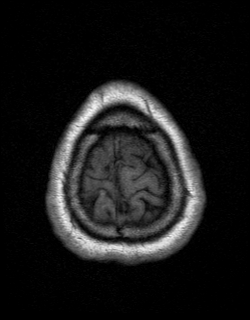
[im 162/176]
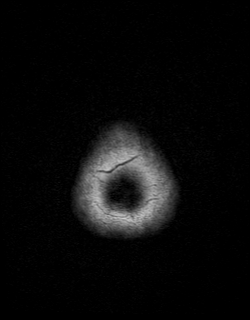
[im 176/176]
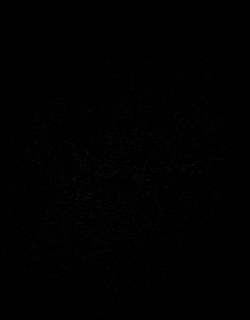

[Series 12: T2 · coronal · 5.0mm · 0.43mm/px · 3 of 34 slices shown (2 of 2)]
[im 1/34]
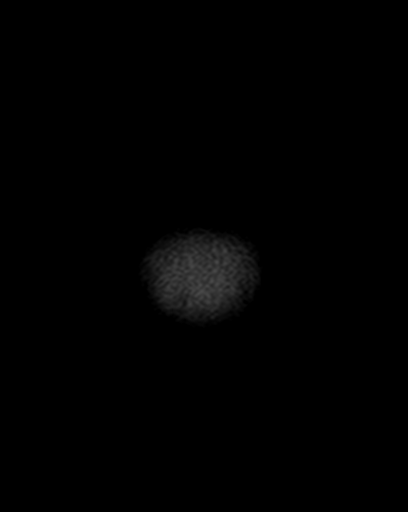
[im 17/34]
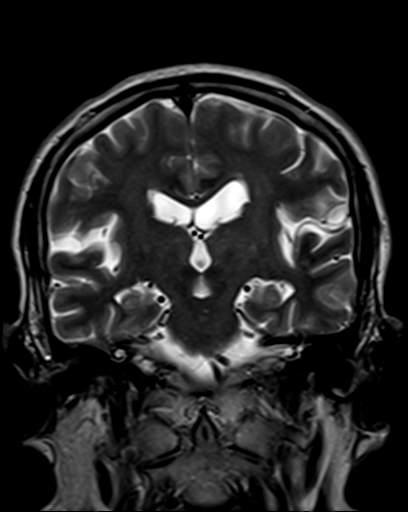
[im 34/34]
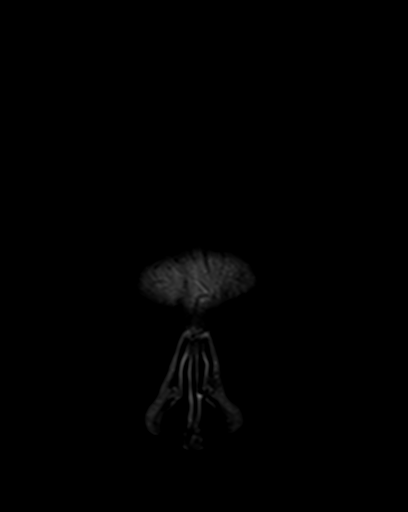

[48 of 48 positions shown; findings below may reference images not displayed]

FINDINGS: Brain: Mild atrophy.  Negative for hydrocephalus.

Negative for acute infarct. Mild periventricular white matter
hyperintensity bilaterally. Negative for hemorrhage, mass, fluid
collection.

Vascular: Normal arterial flow voids.

Skull and upper cervical spine: Negative

Sinuses/Orbits: Mild mucosal edema paranasal sinuses. Bilateral
cataract extraction

Other: None
IMPRESSION: Negative for acute infarct

Mild atrophy and mild chronic microvascular ischemic change in the
white matter.

## 2024-02-08 ENCOUNTER — Ambulatory Visit (INDEPENDENT_AMBULATORY_CARE_PROVIDER_SITE_OTHER): Payer: Medicare Other

## 2024-02-08 VITALS — BP 122/80 | HR 70 | Ht 71.25 in | Wt 214.2 lb

## 2024-02-08 DIAGNOSIS — Z0000 Encounter for general adult medical examination without abnormal findings: Secondary | ICD-10-CM | POA: Diagnosis not present
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
## 2024-02-08 NOTE — Patient Instructions (Addendum)
 [name],  Thank you for taking the time for your Medicare Wellness Visit. I appreciate your continued commitment to your health goals. Please review the care plan we discussed, and feel free to reach out if I can assist you further.  Please note that Annual Wellness Visits do not include a physical exam. Some assessments may be limited, especially if the visit was conducted virtually. If needed, we may recommend an in-person follow-up with your provider.  Ongoing Care Seeing your primary care provider every 3 to 6 months helps us  monitor your health and provide consistent, personalized care. Last office visit on 1/6/205.  You are due for a colonoscopy.  Keep up the good work.   Referrals If a referral was made during today's visit and you haven't received any updates within two weeks, please contact the referred provider directly to check on the status.  Recommended Screenings:  Health Maintenance  Topic Date Due   Medicare Annual Wellness Visit  02/06/2024   Colon Cancer Screening  02/24/2024   DTaP/Tdap/Td vaccine (3 - Td or Tdap) 08/31/2025   Pneumococcal Vaccine for age over 79  Completed   Flu Shot  Completed   Hepatitis C Screening  Completed   Zoster (Shingles) Vaccine  Completed   Meningitis B Vaccine  Aged Out   COVID-19 Vaccine  Discontinued       02/08/2024   10:49 AM  Advanced Directives  Does Patient Have a Medical Advance Directive? No    Vision: Annual vision screenings are recommended for early detection of glaucoma, cataracts, and diabetic retinopathy. These exams can also reveal signs of chronic conditions such as diabetes and high blood pressure.  Dental: Annual dental screenings help detect early signs of oral cancer, gum disease, and other conditions linked to overall health, including heart disease and diabetes.  Please see the attached documents for additional preventive care recommendations.

## 2024-03-14 ENCOUNTER — Encounter: Admitting: Internal Medicine
